# Patient Record
Sex: Female | Born: 1995
Health system: Southern US, Community
[De-identification: ages and names within clinical notes are randomized; demographics above are authoritative.]

## PROBLEM LIST (undated history)

## (undated) DIAGNOSIS — G479 Sleep disorder, unspecified: Secondary | ICD-10-CM

## (undated) DIAGNOSIS — IMO0001 Reserved for inherently not codable concepts without codable children: Secondary | ICD-10-CM

## (undated) DIAGNOSIS — F32A Depression, unspecified: Secondary | ICD-10-CM

## (undated) DIAGNOSIS — F329 Major depressive disorder, single episode, unspecified: Secondary | ICD-10-CM

## (undated) DIAGNOSIS — F419 Anxiety disorder, unspecified: Secondary | ICD-10-CM

## (undated) HISTORY — PX: TONSILLECTOMY: SUR1361

## (undated) HISTORY — DX: Sleep disorder, unspecified: G47.9

## (undated) HISTORY — DX: Reserved for inherently not codable concepts without codable children: IMO0001

## (undated) HISTORY — PX: WISDOM TOOTH EXTRACTION: SHX21

---
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
## 2014-01-04 ENCOUNTER — Ambulatory Visit (INDEPENDENT_AMBULATORY_CARE_PROVIDER_SITE_OTHER): Payer: BC Managed Care – PPO | Admitting: Internal Medicine

## 2014-01-04 ENCOUNTER — Encounter: Payer: Self-pay | Admitting: Internal Medicine

## 2014-01-04 VITALS — BP 116/58 | HR 82 | Temp 99.0°F | Wt 131.5 lb

## 2014-01-04 DIAGNOSIS — N912 Amenorrhea, unspecified: Secondary | ICD-10-CM

## 2014-01-04 DIAGNOSIS — Z3201 Encounter for pregnancy test, result positive: Secondary | ICD-10-CM

## 2014-01-04 LAB — POCT URINE PREGNANCY: PREG TEST UR: POSITIVE

## 2014-01-04 NOTE — Progress Notes (Signed)
Pre visit review using our clinic review tool, if applicable. No additional management support is needed unless otherwise documented below in the visit note. 

## 2014-01-04 NOTE — Patient Instructions (Signed)
Pregnancy - First Trimester  During sexual intercourse, millions of sperm go into the vagina. Only 1 sperm will penetrate and fertilize the female egg while it is in the Fallopian tube. One week later, the fertilized egg implants into the wall of the uterus. An embryo begins to develop into a baby. At 6 to 8 weeks, the eyes and face are formed and the heartbeat can be seen on ultrasound. At the end of 12 weeks (first trimester), all the baby's organs are formed. Now that you are pregnant, you will want to do everything you can to have a healthy baby. Two of the most important things are to get good prenatal care and follow your caregiver's instructions. Prenatal care is all the medical care you receive before the baby's birth. It is given to prevent, find, and treat problems during the pregnancy and childbirth.  PRENATAL EXAMS  · During prenatal visits, your weight, blood pressure, and urine are checked. This is done to make sure you are healthy and progressing normally during the pregnancy.  · A pregnant woman should gain 25 to 35 pounds during the pregnancy. However, if you are overweight or underweight, your caregiver will advise you regarding your weight.  · Your caregiver will ask and answer questions for you.  · Blood work, cervical cultures, other necessary tests, and a Pap test are done during your prenatal exams. These tests are done to check on your health and the probable health of your baby. Tests are strongly recommended and done for HIV with your permission. This is the virus that causes AIDS. These tests are done because medicines can be given to help prevent your baby from being born with this infection should you have been infected without knowing it. Blood work is also used to find out your blood type, previous infections, and follow your blood levels (hemoglobin).  · Low hemoglobin (anemia) is common during pregnancy. Iron and vitamins are given to help prevent this. Later in the pregnancy, blood  tests for diabetes will be done along with any other tests if any problems develop.  · You may need other tests to make sure you and the baby are doing well.  CHANGES DURING THE FIRST TRIMESTER   Your body goes through many changes during pregnancy. They vary from person to person. Talk to your caregiver about changes you notice and are concerned about. Changes can include:  · Your menstrual period stops.  · The egg and sperm carry the genes that determine what you look like. Genes from you and your partner are forming a baby. The female genes determine whether the baby is a boy or a girl.  · Your body increases in girth and you may feel bloated.  · Feeling sick to your stomach (nauseous) and throwing up (vomiting). If the vomiting is uncontrollable, call your caregiver.  · Your breasts will begin to enlarge and become tender.  · Your nipples may stick out more and become darker.  · The need to urinate more. Painful urination may mean you have a bladder infection.  · Tiring easily.  · Loss of appetite.  · Cravings for certain kinds of food.  · At first, you may gain or lose a couple of pounds.  · You may have changes in your emotions from day to day (excited to be pregnant or concerned something may go wrong with the pregnancy and baby).  · You may have more vivid and strange dreams.  HOME CARE INSTRUCTIONS   ·   It is very important to avoid all smoking, alcohol and non-prescribed drugs during your pregnancy. These affect the formation and growth of the baby. Avoid chemicals while pregnant to ensure the delivery of a healthy infant.  · Start your prenatal visits by the 12th week of pregnancy. They are usually scheduled monthly at first, then more often in the last 2 months before delivery. Keep your caregiver's appointments. Follow your caregiver's instructions regarding medicine use, blood and lab tests, exercise, and diet.  · During pregnancy, you are providing food for you and your baby. Eat regular, well-balanced  meals. Choose foods such as meat, fish, milk and other low fat dairy products, vegetables, fruits, and whole-grain breads and cereals. Your caregiver will tell you of the ideal weight gain.  · You can help morning sickness by keeping soda crackers at the bedside. Eat a couple before arising in the morning. You may want to use the crackers without salt on them.  · Eating 4 to 5 small meals rather than 3 large meals a day also may help the nausea and vomiting.  · Drinking liquids between meals instead of during meals also seems to help nausea and vomiting.  · A physical sexual relationship may be continued throughout pregnancy if there are no other problems. Problems may be early (premature) leaking of amniotic fluid from the membranes, vaginal bleeding, or belly (abdominal) pain.  · Exercise regularly if there are no restrictions. Check with your caregiver or physical therapist if you are unsure of the safety of some of your exercises. Greater weight gain will occur in the last 2 trimesters of pregnancy. Exercising will help:  · Control your weight.  · Keep you in shape.  · Prepare you for labor and delivery.  · Help you lose your pregnancy weight after you deliver your baby.  · Wear a good support or jogging bra for breast tenderness during pregnancy. This may help if worn during sleep too.  · Ask when prenatal classes are available. Begin classes when they are offered.  · Do not use hot tubs, steam rooms, or saunas.  · Wear your seat belt when driving. This protects you and your baby if you are in an accident.  · Avoid raw meat, uncooked cheese, cat litter boxes, and soil used by cats throughout the pregnancy. These carry germs that can cause birth defects in the baby.  · The first trimester is a good time to visit your dentist for your dental health. Getting your teeth cleaned is okay. Use a softer toothbrush and brush gently during pregnancy.  · Ask for help if you have financial, counseling, or nutritional needs  during pregnancy. Your caregiver will be able to offer counseling for these needs as well as refer you for other special needs.  · Do not take any medicines or herbs unless told by your caregiver.  · Inform your caregiver if there is any mental or physical domestic violence.  · Make a list of emergency phone numbers of family, friends, hospital, and police and fire departments.  · Write down your questions. Take them to your prenatal visit.  · Do not douche.  · Do not cross your legs.  · If you have to stand for long periods of time, rotate you feet or take small steps in a circle.  · You may have more vaginal secretions that may require a sanitary pad. Do not use tampons or scented sanitary pads.  MEDICINES AND DRUG USE IN PREGNANCY  ·   Take prenatal vitamins as directed. The vitamin should contain 1 milligram of folic acid. Keep all vitamins out of reach of children. Only a couple vitamins or tablets containing iron may be fatal to a baby or young child when ingested.  · Avoid use of all medicines, including herbs, over-the-counter medicines, not prescribed or suggested by your caregiver. Only take over-the-counter or prescription medicines for pain, discomfort, or fever as directed by your caregiver. Do not use aspirin, ibuprofen, or naproxen unless directed by your caregiver.  · Let your caregiver also know about herbs you may be using.  · Alcohol is related to a number of birth defects. This includes fetal alcohol syndrome. All alcohol, in any form, should be avoided completely. Smoking will cause low birth rate and premature babies.  · Street or illegal drugs are very harmful to the baby. They are absolutely forbidden. A baby born to an addicted mother will be addicted at birth. The baby will go through the same withdrawal an adult does.  · Let your caregiver know about any medicines that you have to take and for what reason you take them.  SEEK MEDICAL CARE IF:   You have any concerns or worries during your  pregnancy. It is better to call with your questions if you feel they cannot wait, rather than worry about them.  SEEK IMMEDIATE MEDICAL CARE IF:   · An unexplained oral temperature above 102° F (38.9° C) develops, or as your caregiver suggests.  · You have leaking of fluid from the vagina (birth canal). If leaking membranes are suspected, take your temperature and inform your caregiver of this when you call.  · There is vaginal spotting or bleeding. Notify your caregiver of the amount and how many pads are used.  · You develop a bad smelling vaginal discharge with a change in the color.  · You continue to feel sick to your stomach (nauseated) and have no relief from remedies suggested. You vomit blood or coffee ground-like materials.  · You lose more than 2 pounds of weight in 1 week.  · You gain more than 2 pounds of weight in 1 week and you notice swelling of your face, hands, feet, or legs.  · You gain 5 pounds or more in 1 week (even if you do not have swelling of your hands, face, legs, or feet).  · You get exposed to German measles and have never had them.  · You are exposed to fifth disease or chickenpox.  · You develop belly (abdominal) pain. Round ligament discomfort is a common non-cancerous (benign) cause of abdominal pain in pregnancy. Your caregiver still must evaluate this.  · You develop headache, fever, diarrhea, pain with urination, or shortness of breath.  · You fall or are in a car accident or have any kind of trauma.  · There is mental or physical violence in your home.  Document Released: 08/28/2001 Document Revised: 05/28/2012 Document Reviewed: 03/01/2009  [employer]® Patient Information ©2014 [employer].

## 2014-01-04 NOTE — Progress Notes (Signed)
Subjective:    Patient ID: Caroline Gonzalez, female    DOB: 1996/09/17, 18 y.o.   MRN: [id number]  HPI  Pt presents to the clinic today with c/o possible pregnancy. She reports she took a home pregnancy test and it was positive. This was not planned but she is excited. Her LMP was 11/28/13. She denies any symptoms that she is aware of.  Review of Systems      Past Medical History  Diagnosis Date  . Depression   . Anxiety     Current Outpatient Prescriptions  Medication Sig Dispense Refill  . FLUoxetine (PROZAC) 40 MG capsule Take 40 mg by mouth daily.      . hydrOXYzine (ATARAX/VISTARIL) 25 MG tablet Take 25 mg by mouth 2 (two) times daily.      Marland Kitchen. lamoTRIgine (LAMICTAL) 100 MG tablet Take 100 mg by mouth daily. Take 1 tablet in the morning and 1/2 tablet at bedtime      . QUEtiapine (SEROQUEL) 50 MG tablet Take 50 mg by mouth at bedtime.       No current facility-administered medications for this visit.    Allergies  Allergen Reactions  . Codeine     REACTION: hallucinations  . Morphine     REACTION: hallucinations  . Sulfa Antibiotics     No family history on file.  History   Social History  . Marital Status: Married    Spouse Name: N/A    Number of Children: N/A  . Years of Education: N/A   Occupational History  . Not on file.   Social History Main Topics  . Smoking status: Former Smoker -- 0.25 packs/day    Types: Cigarettes  . Smokeless tobacco: Never Used  . Alcohol Use: No  . Drug Use: No     Comment: THC--recently quit  . Sexual Activity: No   Other Topics Concern  . Not on file   Social History Narrative  . No narrative on file     Constitutional: Denies fever, malaise, fatigue, headache or abrupt weight changes.  Gastrointestinal: Denies abdominal pain, bloating, constipation, diarrhea or blood in the stool.  GU: Pt reports missed menses. Denies urgency, frequency, pain with urination, burning sensation, blood in urine, odor or  discharge.   No other specific complaints in a complete review of systems (except as listed in HPI above).  Objective:   Physical Exam   BP 116/58  Pulse 82  Temp(Src) 99 F (37.2 C) (Oral)  Wt 131 lb 8 oz (59.648 kg)  SpO2 99%  LMP 12/01/2013 Wt Readings from Last 3 Encounters:  01/04/14 131 lb 8 oz (59.648 kg) (62%*, Z = 0.30)  10/28/13 131 lb 8 oz (59.648 kg) (63%*, Z = 0.33)  09/02/13 132 lb 12 oz (60.215 kg) (65%*, Z = 0.39)   * Growth percentiles are based on CDC 2-20 Years data.    General: Appears her stated age, well developed, well nourished in NAD. Cardiovascular: Normal rate and rhythm. S1,S2 noted.  No murmur, rubs or gallops noted. No JVD or BLE edema. No carotid bruits noted. Pulmonary/Chest: Normal effort and positive vesicular breath sounds. No respiratory distress. No wheezes, rales or ronchi noted.  Abdomen: Soft and nontender. Normal bowel sounds, no bruits noted. No distention or masses noted. Liver, spleen and kidneys non palpable.        Assessment & Plan:  Positive pregnancy test:  Discussed things not to do in the first trimester She has decided to quit smoking Urine  HCg positive Encouraged her to call her psychiatrist to discuss her psych meds during pregnancy [facility]  Followup as needed

## 2014-01-05 ENCOUNTER — Telehealth: Payer: Self-pay

## 2014-01-05 NOTE — Telephone Encounter (Signed)
Mother was in there with her. Ok to give copy of results

## 2014-01-05 NOTE — Telephone Encounter (Signed)
Jasmine DecemberSharon, pts mother request copy of pregnancy test results to take to OB office;pt has 1st OB appt 01/06/14. do not see DPR signed and unable to reach pt by phone. Please advise.

## 2014-01-06 NOTE — Telephone Encounter (Signed)
Spoke to pt's mother and she is aware that a copy of results is ready

## 2014-05-06 ENCOUNTER — Telehealth: Payer: Self-pay

## 2014-05-06 NOTE — Telephone Encounter (Signed)
Pts mother request copy of immunizations for college; notified copy at front desk for pick up.

## 2014-05-17 ENCOUNTER — Encounter: Payer: Self-pay | Admitting: Maternal & Fetal Medicine

## 2014-05-18 ENCOUNTER — Encounter: Payer: Self-pay | Admitting: Maternal & Fetal Medicine

## 2014-06-01 ENCOUNTER — Observation Stay: Payer: Self-pay | Admitting: Obstetrics and Gynecology

## 2014-07-22 IMAGING — CT CT ABD-PELV W/ CM
2 of 4 series · 17 of 46 positions shown, 19 images · IV contrast (30CC OMNI 300 ?  & [ID] OMNI 300)
Comparison: None.

CLINICAL DATA: Question appendicitis, renal stones.  Leukocytosis
with mid abdominal pain.

CT ABDOMEN AND PELVIS WITH CONTRAST
TECHNIQUE: Multidetector CT imaging of the abdomen and pelvis was
performed following the standard protocol during bolus
administration of intravenous contrast.
Contrast: 100mL OMNIPAQUE IOHEXOL 300 MG/ML  SOLN

[Series 2: abd/pelvis with · axial · 0.70mm/px · z∈[-483,-43]mm · 14 of 96 slices shown, 16 images]
[im 4/96  soft-tissue]
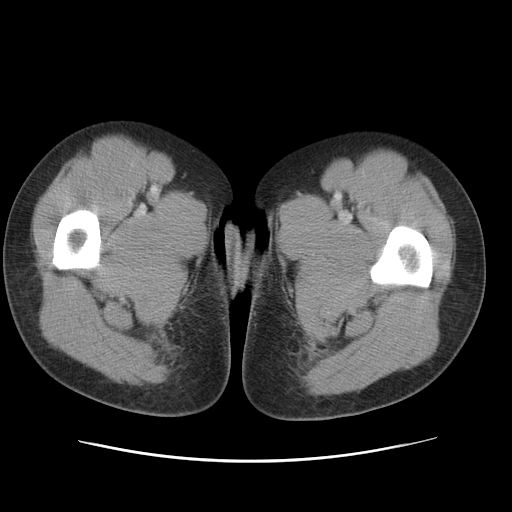
[im 4/96  bone]
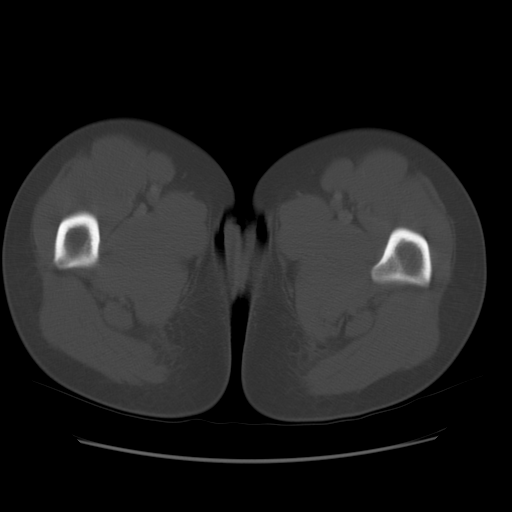
[im 11/96  soft-tissue]
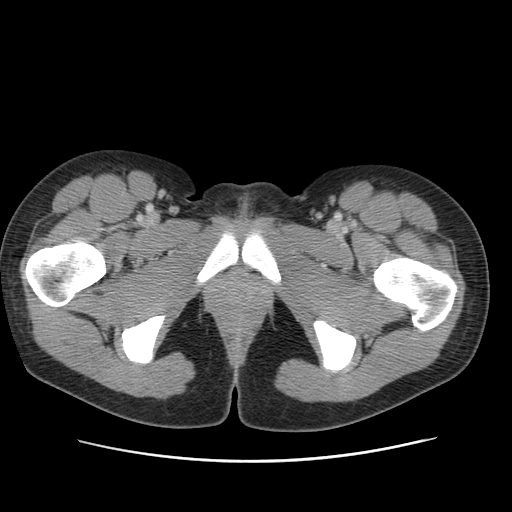
[im 18/96  soft-tissue]
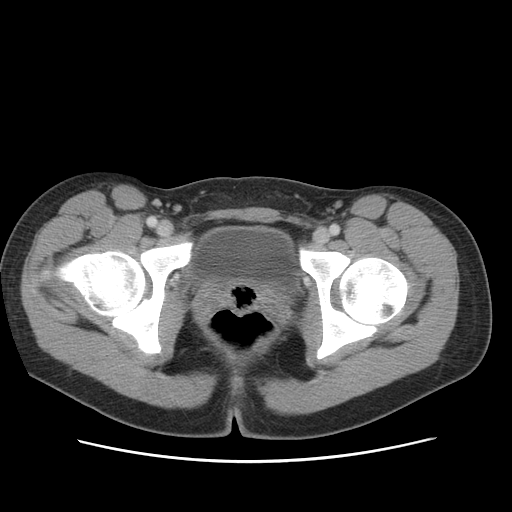
[im 25/96  soft-tissue]
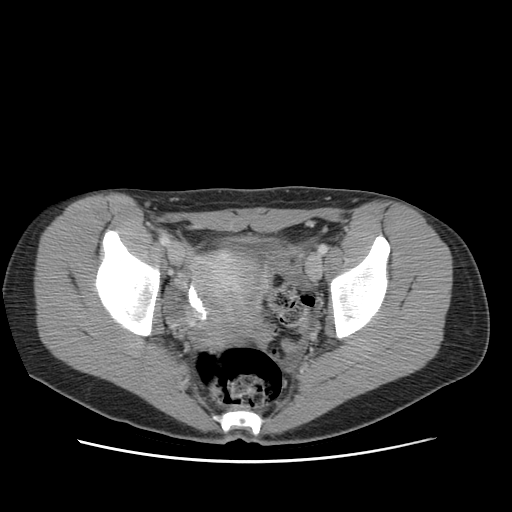
[im 32/96  soft-tissue]
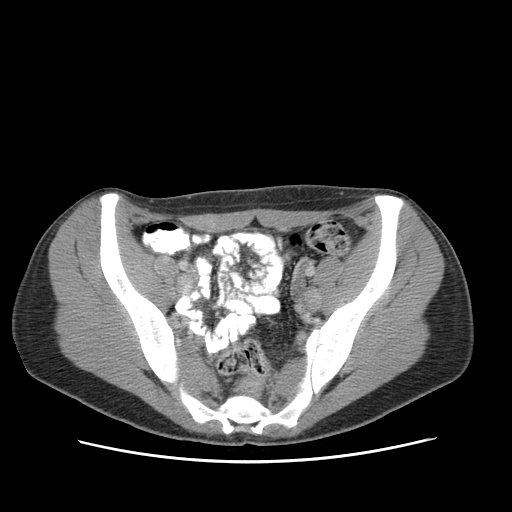
[im 39/96  soft-tissue]
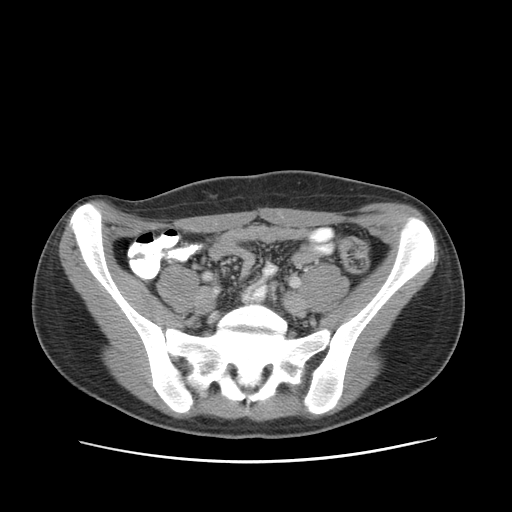
[im 46/96  soft-tissue]
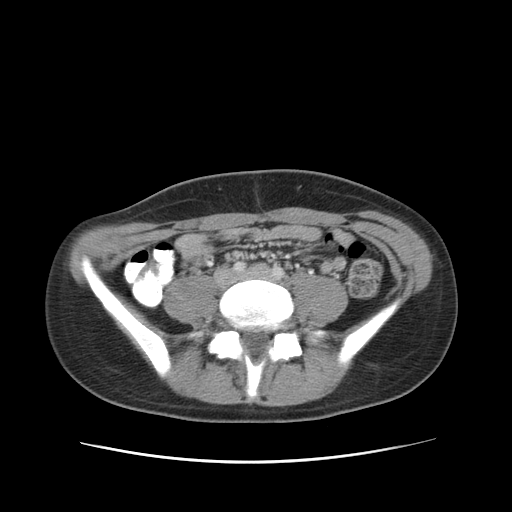
[im 50/96  soft-tissue]
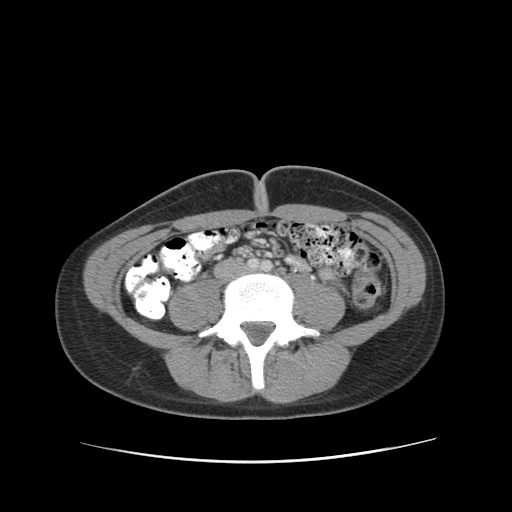
[im 57/96  soft-tissue]
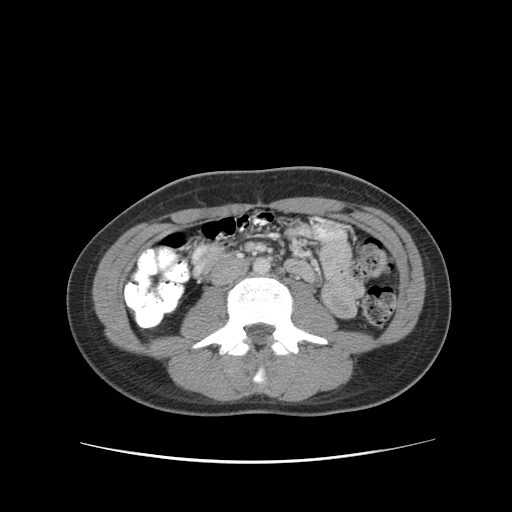
[im 57/96  bone]
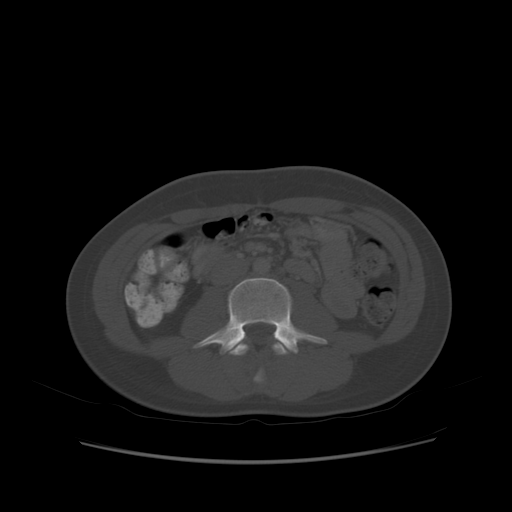
[im 64/96  soft-tissue]
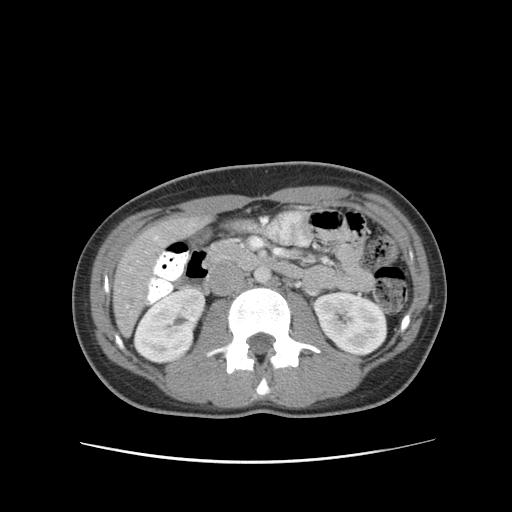
[im 71/96  soft-tissue]
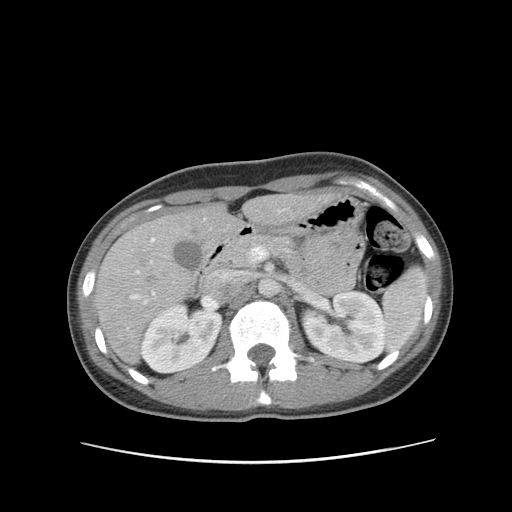
[im 78/96  soft-tissue]
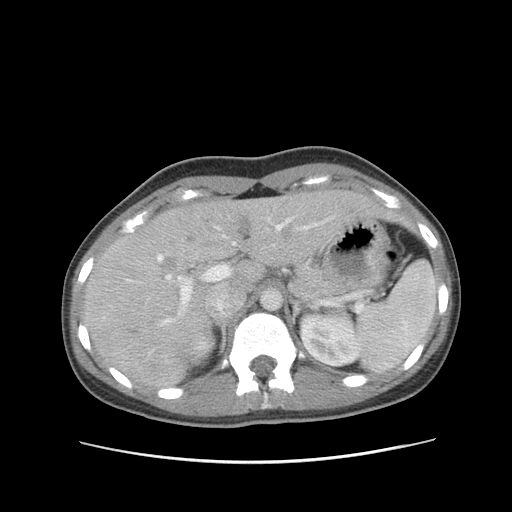
[im 85/96  soft-tissue]
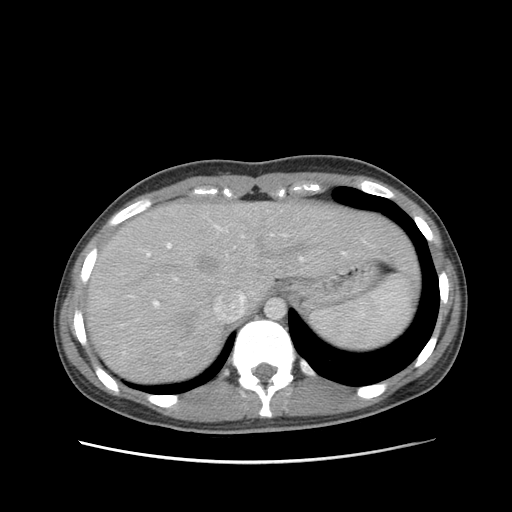
[im 92/96  soft-tissue]
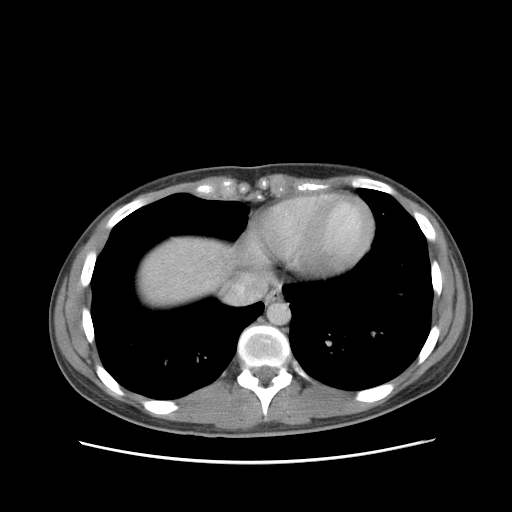

[Series 400: cor · coronal · 0.95mm/px · 3 of 110 slices shown]
[im 37/110  soft-tissue]
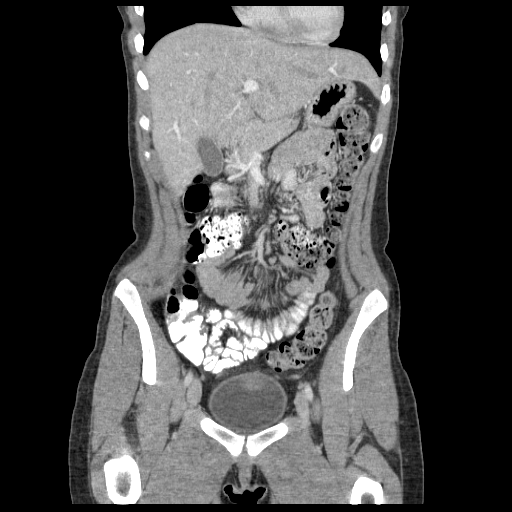
[im 49/110  soft-tissue]
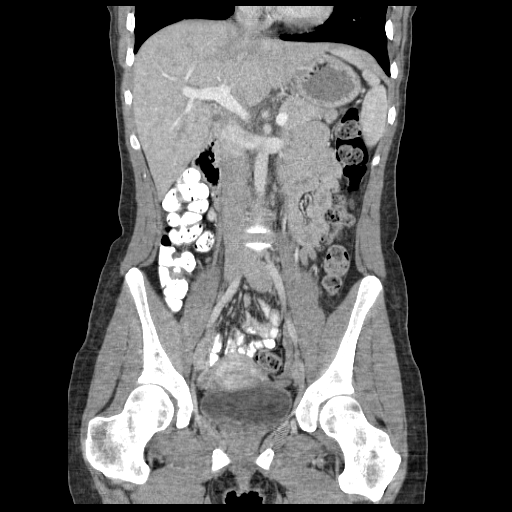
[im 61/110  soft-tissue]
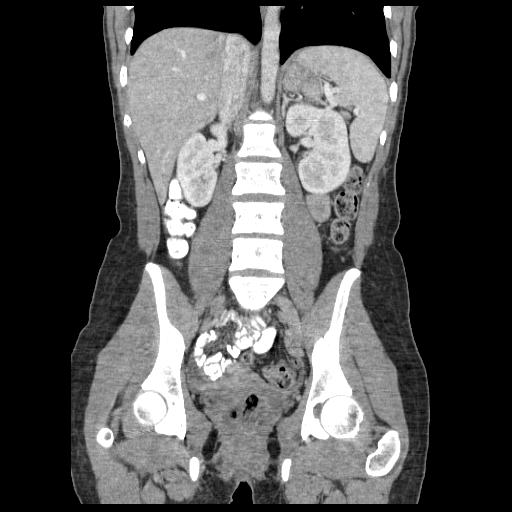

[17 of 46 positions shown; findings below may reference images not displayed]

FINDINGS: Lung bases show no acute findings.  Heart size normal.
No pericardial or pleural effusion.

Liver, gallbladder, adrenal glands, kidneys, spleen, pancreas,
stomach, small bowel and appendix unremarkable.  A fair amount
stool is seen in the colon which is otherwise unremarkable.

Trace pelvic free fluid.  Uterus and ovaries are visualized.
Scattered unenlarged lymph nodes.  No worrisome lytic or sclerotic
lesions.
IMPRESSION: 1.  No evidence of urinary obstruction or appendicitis.
2.  Suspect mild constipation.
3.  Trace pelvic free fluid is likely physiologic.

## 2014-09-01 ENCOUNTER — Observation Stay: Payer: Self-pay | Admitting: Obstetrics and Gynecology

## 2014-09-06 ENCOUNTER — Inpatient Hospital Stay: Payer: Self-pay

## 2014-09-06 LAB — CBC WITH DIFFERENTIAL/PLATELET
BASOS ABS: 0.1 10*3/uL (ref 0.0–0.1)
Basophil %: 0.6 %
Eosinophil #: 0.1 10*3/uL (ref 0.0–0.7)
Eosinophil %: 0.9 %
HCT: 42.3 % (ref 35.0–47.0)
HGB: 13.8 g/dL (ref 12.0–16.0)
Lymphocyte #: 2.3 10*3/uL (ref 1.0–3.6)
Lymphocyte %: 16.9 %
MCH: 28.8 pg (ref 26.0–34.0)
MCHC: 32.6 g/dL (ref 32.0–36.0)
MCV: 88 fL (ref 80–100)
Monocyte #: 1.5 x10 3/mm — ABNORMAL HIGH (ref 0.2–0.9)
Monocyte %: 10.6 %
NEUTROS PCT: 71 %
Neutrophil #: 9.9 10*3/uL — ABNORMAL HIGH (ref 1.4–6.5)
Platelet: 261 10*3/uL (ref 150–440)
RBC: 4.79 10*6/uL (ref 3.80–5.20)
RDW: 13.1 % (ref 11.5–14.5)
WBC: 13.9 10*3/uL — ABNORMAL HIGH (ref 3.6–11.0)

## 2014-09-07 LAB — HEMATOCRIT: HCT: 38.4 % (ref 35.0–47.0)

## 2014-09-07 LAB — GC/CHLAMYDIA PROBE AMP

## 2015-01-25 NOTE — H&P (Signed)
L&D Evaluation:  History:  HPI 19yo SWF presents for IOL for postterm pregnancy, 40w 2d G1P0000; PNC complicated by shortened cervix and anxiety.   Patient's Medical History anxiety   Patient's Surgical History T&A   Medications Pre Natal Vitamins  Tylenol (Acetaminophen)  Diclegis   Allergies Sulfa, Codeine, TamiFlu   Social History none   Family History Non-Contributory   ROS:  ROS All systems were reviewed.  HEENT, CNS, GI, GU, Respiratory, CV, Renal and Musculoskeletal systems were found to be normal.   Exam:  Vital Signs stable   General no apparent distress   Mental Status clear   Chest clear   Heart normal sinus rhythm   Abdomen gravid, non-tender   Estimated Fetal Weight Average for gestational age   Fetal Position vtx   Edema no edema   Pelvic no external lesions, 4/75/-2   Mebranes Ruptured, AROM   Description clear   FHT normal rate with no decels   Fetal Heart Rate 119   Ucx irregular   Ucx Frequency 6 min   Length of each Contraction 40 seconds   Ucx Pain Scale 2   Skin dry   Impression:  Impression reactive NST, Postterm IOL   Plan:  Plan monitor contractions and for cervical change, antibiotics for GBBS prophylaxis, pitocin IOL   Electronic Signatures: Yolanda BonineBurr, Melody N (CNM)  (Signed 21-Dec-15 09:36)  Authored: L&D Evaluation   Last Updated: 21-Dec-15 09:36 by Ulyses AmorBurr, Melody N (CNM)

## 2015-02-17 ENCOUNTER — Encounter: Payer: Self-pay | Admitting: Obstetrics and Gynecology

## 2015-02-17 ENCOUNTER — Ambulatory Visit (INDEPENDENT_AMBULATORY_CARE_PROVIDER_SITE_OTHER): Payer: BLUE CROSS/BLUE SHIELD | Admitting: Obstetrics and Gynecology

## 2015-02-17 ENCOUNTER — Encounter: Payer: Self-pay | Admitting: *Deleted

## 2015-02-17 VITALS — BP 128/63 | HR 85 | Ht 67.0 in | Wt 152.9 lb

## 2015-02-17 DIAGNOSIS — Z0000 Encounter for general adult medical examination without abnormal findings: Secondary | ICD-10-CM | POA: Diagnosis not present

## 2015-02-17 MED ORDER — FLUOXETINE HCL 40 MG PO CAPS: 40.0000 mg | ORAL_CAPSULE | Freq: Every day | ORAL | Status: DC

## 2015-02-17 NOTE — Progress Notes (Signed)
SUBJECTIVE:  19 y.o. female for annual routine Pap and checkup. Current Outpatient Prescriptions  Medication Sig Dispense Refill  . etonogestrel (NEXPLANON) 68 MG IMPL implant 1 each by Subdermal route once.    Marland Kitchen. FLUoxetine (PROZAC) 40 MG capsule Take 40 mg by mouth daily.     No current facility-administered medications for this visit.   Allergies: Codeine; Morphine; Sulfa antibiotics; and Tamiflu  No LMP recorded.  ROS:  Feeling well. No dyspnea or chest pain on exertion.  No abdominal pain, change in bowel habits, black or bloody stools.  No urinary tract symptoms. GYN ROS: no breast pain or new or enlarging lumps on self exam, no vaginal bleeding. No neurological complaints.  OBJECTIVE:  The patient appears well, alert, oriented x 3, in no distress. BP 128/63 mmHg  Pulse 85  Ht 5\' 7"  (1.702 m)  Wt 152 lb 14.4 oz (69.355 kg)  BMI 23.94 kg/m2  Breastfeeding? Yes ENT normal.  Neck supple. No adenopathy or thyromegaly. PERLA. Lungs are clear, good air entry, no wheezes, rhonchi or rales. S1 and S2 normal, no murmurs, regular rate and rhythm. Abdomen soft without tenderness, guarding, mass or organomegaly. Extremities show no edema, normal peripheral pulses. Neurological is normal, no focal findings.  BREAST EXAM: patient declines to have breast exam  PELVIC EXAM: normal external genitalia, vulva, vagina, cervix, uterus and adnexa  ASSESSMENT:  well woman Amenorrhea secondary to breastfeeding Anxiety under good control with SSRI  PLAN:  Refill prozac x 1 year  Melody N Burr,CNM return annually or prn

## 2015-08-09 ENCOUNTER — Ambulatory Visit (INDEPENDENT_AMBULATORY_CARE_PROVIDER_SITE_OTHER): Payer: BLUE CROSS/BLUE SHIELD

## 2015-08-09 DIAGNOSIS — Z23 Encounter for immunization: Secondary | ICD-10-CM | POA: Diagnosis not present

## 2015-12-23 ENCOUNTER — Telehealth: Payer: Self-pay | Admitting: Obstetrics and Gynecology

## 2015-12-23 NOTE — Telephone Encounter (Signed)
If patient is doing well on prozac, i don't want to change it. Have her to ask pharmacy to send refill request.

## 2015-12-23 NOTE — Telephone Encounter (Signed)
CALLED MOTHER BACK AND TOLD HER THAT AS LONG AS SHE IS DOING GOOD ON PROZAC SHE NEEDS TO STAY ON IT, MOTHER STATED THAT SHE IS DOING OK ON IT THAT SHE STOPPED BREASTFEEDING AND WAS HAVING A HARD TIME WITH THAT.. SO MOTHER IS GOING TO CALL IN REQ FROM PHARMACY TO SEND REFILL ON PROZAC AND IF OVER THE NEXT MONTH IT DOESN'T HELP SHE WILL CALL US FOR PT TO BE SEEN TO CHANGE IF NECESSARY.

## 2015-12-23 NOTE — Telephone Encounter (Signed)
PTS MOTHER CALLED AND SHE DOES NEED A REFILL ON THE PROZAC, BUT MOTHER WAS WANTING TO KNOW IF IT COULD BE CHANGED CELEXA. IF NOT THEN SHE SAID WE CAN CALL HER AND GET HER TO REQ. A REFILL FROM HER PHARMACY FOR THE PROZAC, SHE HAS 2 PILLS LEFT OF THE PROZAC.

## 2016-02-14 ENCOUNTER — Telehealth: Payer: Self-pay | Admitting: Obstetrics and Gynecology

## 2016-02-14 ENCOUNTER — Other Ambulatory Visit: Payer: Self-pay | Admitting: Obstetrics and Gynecology

## 2016-02-14 MED ORDER — NORGESTIMATE-ETH ESTRADIOL 0.25-35 MG-MCG PO TABS: 1.0000 | ORAL_TABLET | Freq: Every day | ORAL | Status: DC

## 2016-02-14 NOTE — Telephone Encounter (Signed)
Please let her know I sent in a prescription for pills- to take one daily until bleeding stops, and if it doesn't work, we may need to switch BC method.

## 2016-02-14 NOTE — Telephone Encounter (Signed)
pls advise

## 2016-02-14 NOTE — Telephone Encounter (Signed)
She has implant in arm ... She is having heavy bleeding continuously

## 2016-02-14 NOTE — Telephone Encounter (Signed)
Left message for pt

## 2016-02-21 ENCOUNTER — Telehealth: Payer: Self-pay | Admitting: *Deleted

## 2016-02-21 NOTE — Telephone Encounter (Signed)
Tell her she may need to consider removing the implant since bleeding continues without taking the pills. Will need to consider another form of birth control. She is fine to take the pills to stop bleeding until her appointment as it is not harmful.  Tyrese Capriotti

## 2016-02-21 NOTE — Telephone Encounter (Signed)
Patient mother called and stated the patient is on birth control and is having some irregular bleeding and cramping. She has the implant and was also taking birth control pills to stop the bleeding while she had the implant in her arm. She stop taking the pills after 3 days. Patient is complaining of irregular bleeding and cramping. Patient has an appt with Melody on 03/21/16. Patient is requesting a call back [phone].

## 2016-02-23 ENCOUNTER — Telehealth: Payer: Self-pay | Admitting: Obstetrics and Gynecology

## 2016-02-23 NOTE — Telephone Encounter (Signed)
Pt mother aware and will have her resume pills. Pt had stopped because she stopped bleeding. Now she is bleeding, cramping and bloating. To continue until her appt. Will take IBP for cramping/pain as needed.

## 2016-03-06 ENCOUNTER — Telehealth: Payer: Self-pay | Admitting: Obstetrics and Gynecology

## 2016-03-06 NOTE — Telephone Encounter (Signed)
Patients Mother Jasmine DecemberSharon called stating the prozac Caroline Gonzalez is on is not working anymore. She stated that Melody told her to call if the meds stopped working. The mother can be reached at [phone]. Thanks

## 2016-03-07 NOTE — Telephone Encounter (Signed)
Please let her know to stay on it until appt on 03/21/16, and that she can increase the dose to 2 pills daily till then to see if that helps.

## 2016-03-07 NOTE — Telephone Encounter (Signed)
Pt notified to increase prozac to 2 pills daily until visit on 03/21/2016 per MNS.

## 2016-03-08 NOTE — Telephone Encounter (Signed)
MOTHER CALLED BACK AND SAID SHE LEFT MSG 2 DAYS AGO AND NOONE CALLED HER BACK.

## 2016-03-08 NOTE — Telephone Encounter (Signed)
Caroline Gonzalez did you call her yet- I sent the message to you

## 2016-03-08 NOTE — Telephone Encounter (Signed)
I called Caroline Gonzalez herself and told her that her mother had called and to let her know that we had spoke.

## 2016-03-16 ENCOUNTER — Telehealth: Payer: Self-pay | Admitting: *Deleted

## 2016-03-16 NOTE — Telephone Encounter (Signed)
Patient mother called and stated that the patient will need a refill of her birth control pill. She does have the Nexplanon implant as well.  Patient will be out on Monday and she sees Melody on 03/21/16. Her pharmacy is [facility] on [address]. Thanks

## 2016-03-16 NOTE — Telephone Encounter (Signed)
Ok to refill 

## 2016-03-19 ENCOUNTER — Other Ambulatory Visit: Payer: Self-pay | Admitting: *Deleted

## 2016-03-19 MED ORDER — NORGESTIMATE-ETH ESTRADIOL 0.25-35 MG-MCG PO TABS: 1.0000 | ORAL_TABLET | Freq: Every day | ORAL | Status: DC

## 2016-03-19 NOTE — Telephone Encounter (Signed)
Done-ac 

## 2016-03-19 NOTE — Telephone Encounter (Signed)
Yes to refill     

## 2016-03-21 ENCOUNTER — Ambulatory Visit (INDEPENDENT_AMBULATORY_CARE_PROVIDER_SITE_OTHER): Payer: BLUE CROSS/BLUE SHIELD | Admitting: Obstetrics and Gynecology

## 2016-03-21 ENCOUNTER — Encounter: Payer: Self-pay | Admitting: Obstetrics and Gynecology

## 2016-03-21 VITALS — BP 124/80 | HR 88 | Ht 67.0 in | Wt 152.5 lb

## 2016-03-21 DIAGNOSIS — Z3046 Encounter for surveillance of implantable subdermal contraceptive: Secondary | ICD-10-CM | POA: Diagnosis not present

## 2016-03-21 DIAGNOSIS — Z30430 Encounter for insertion of intrauterine contraceptive device: Secondary | ICD-10-CM | POA: Diagnosis not present

## 2016-03-21 DIAGNOSIS — Z975 Presence of (intrauterine) contraceptive device: Secondary | ICD-10-CM

## 2016-03-21 DIAGNOSIS — N921 Excessive and frequent menstruation with irregular cycle: Secondary | ICD-10-CM | POA: Diagnosis not present

## 2016-03-21 MED ORDER — LEVONORGESTREL 20 MCG/24HR IU IUD: 1.0000 | INTRAUTERINE_SYSTEM | Freq: Once | INTRAUTERINE | Status: DC

## 2016-03-21 NOTE — Progress Notes (Signed)
Caroline ComesBrandi Tyson Gonzalez is a 20 y.o. year old Caucasian female here for Implanon removal.  Patient given informed consent for removal of her Implanon due to prolonged menses. Desires switching to Mirena IUD.  BP 124/80 mmHg  Pulse 88  Ht 5\' 7"  (1.702 m)  Wt 152 lb 8 oz (69.174 kg)  BMI 23.88 kg/m2  LMP 03/14/2016  Breastfeeding? No  Appropriate time out taken. Implanon site identified.  Area prepped in usual sterile fashon. One cc of 2% lidocaine was used to anesthetize the area at the distal end of the implant. A small stab incision was made right beside the implant on the distal portion.  The Implanon rod was grasped using hemostats and removed without difficulty.  There was less than 3 cc blood loss. There were no complications.  Steri-strips were applied over the small incision and a pressure bandage was applied.  The patient tolerated the procedure well.  She was instructed to keep the area clean and dry, remove pressure bandage in 24 hours, and keep insertion site covered with the steri-strip for 3-5 days.    Follow-up PRN problems.         The risks and benefits of the method and placement have been thouroughly reviewed with the patient and all questions were answered.  Specifically the patient is aware of failure rate of 09/998, expulsion of the IUD and of possible perforation.  The patient is aware of irregular bleeding due to the method and understands the incidence of irregular bleeding diminishes with time.  Signed copy of informed consent in chart.   Time out was performed.  A Graves speculum was placed in the vagina.  The cervix was visualized, prepped using Betadine, and grasped with a single tooth tenaculum. The uterus was found to be neutral and it sounded to 7 cm.  Mirena IUD placed per manufacturer's recommendations.   The strings were trimmed to 3 cm.   The patient was given post procedure instructions, including signs and symptoms of infection and to check for the  strings after each menses or each month, and refraining from intercourse or anything in the vagina for 3 days.  She was given a Mirena care card with date Mirena placed, and date Mirena to be removed.    [name] [name] [name], CNM

## 2016-03-21 NOTE — Patient Instructions (Signed)

## 2016-04-26 ENCOUNTER — Telehealth: Payer: Self-pay | Admitting: Obstetrics and Gynecology

## 2016-04-26 NOTE — Telephone Encounter (Signed)
Patient called regarding issues with her birth control. You can reach her at [phone].thanks

## 2016-04-27 NOTE — Telephone Encounter (Signed)
Pt states she was doing well with IUD @ first, then about 2 weeks ago started bleeding, states she is bleeding heavily, is up moving a lot is changing pad q hour and 1/2

## 2016-04-27 NOTE — Telephone Encounter (Signed)
I would continue to watch, and we will check it at next weeks appt

## 2016-04-30 NOTE — Telephone Encounter (Signed)
Left detailed message for pt 

## 2016-05-03 ENCOUNTER — Encounter: Payer: BLUE CROSS/BLUE SHIELD | Admitting: Obstetrics and Gynecology

## 2016-05-11 ENCOUNTER — Other Ambulatory Visit: Payer: Self-pay | Admitting: Obstetrics and Gynecology

## 2016-05-17 ENCOUNTER — Encounter: Payer: Self-pay | Admitting: Obstetrics and Gynecology

## 2016-05-17 ENCOUNTER — Ambulatory Visit (INDEPENDENT_AMBULATORY_CARE_PROVIDER_SITE_OTHER): Payer: BLUE CROSS/BLUE SHIELD | Admitting: Obstetrics and Gynecology

## 2016-05-17 VITALS — BP 105/68 | HR 81 | Ht 67.0 in | Wt 153.7 lb

## 2016-05-17 DIAGNOSIS — F411 Generalized anxiety disorder: Secondary | ICD-10-CM | POA: Diagnosis not present

## 2016-05-17 DIAGNOSIS — Z975 Presence of (intrauterine) contraceptive device: Principal | ICD-10-CM

## 2016-05-17 DIAGNOSIS — N921 Excessive and frequent menstruation with irregular cycle: Secondary | ICD-10-CM | POA: Diagnosis not present

## 2016-05-17 MED ORDER — FLUOXETINE HCL 10 MG PO CAPS: 10.0000 mg | ORAL_CAPSULE | Freq: Every day | ORAL | 0 refills | Status: DC

## 2016-05-17 MED ORDER — ESCITALOPRAM OXALATE 10 MG PO TABS: 10.0000 mg | ORAL_TABLET | Freq: Every day | ORAL | 6 refills | Status: DC

## 2016-05-17 NOTE — Progress Notes (Signed)
Subjective:     Patient ID: Caroline Gonzalez, female   DOB: [birth year], 20 y.o.   MRN: [id number]  HPI Reports intermittent if not daily spotting and at times heavy menstrual bleeding since Mirena IUD placed on 04/05/16. States only a few days of no bleeding at all. Wants to make sure IUD is in correct place.  Also reports worsening anxiety, stating it exacerbated since delivery of son last year. Has daily crying spells, feels anxious and overwhelmed.  Depression screen PHQ 2/9 05/17/2016  Decreased Interest 1  Down, Depressed, Hopeless 3  PHQ - 2 Score 4  Altered sleeping 3  Tired, decreased energy 3  Change in appetite 1  Feeling bad or failure about yourself  3  Trouble concentrating 3  Moving slowly or fidgety/restless 1  Suicidal thoughts 0  PHQ-9 Score 18  has been on prozac for many years, and never felt like it really worked and isn't working now. Mom and boyfriend are supportive. Is able to function at work and school.   Review of Systems Negative ROS expect that stated in HPI above.    Objective:   Physical Exam A&Ox4 Well groomed female, anxious Blood pressure 105/68, pulse 81, height 5' 7" (1.702 m), weight 153 lb 11.2 oz (69.7 kg), last menstrual period 03/21/2016, not currently breastfeeding. Thyroid normal on exam HRR Abdomen soft and non-tender Pelvic exam: normal external genitalia, vulva, vagina, cervix, uterus and adnexa, IUD string noted with copious white discharge at cervix- no blood noted. Microscopic wet-mount exam shows negative for pathogens, normal epithelial cells.    Assessment:     BTB with IUD GAD- not under good control with current SSRI    Plan:     Reassured of normal findings with Mirena- desires to continue use at this time- will do menstrual charting and bring back at next visit in 6 weeks.  Discussed changing SSRI treatment, and will taper off prozac- starting at 22m daily now for 1 weeks,then 335mdaily daily for 1 week, then 2032mdaily for one week, then 10 mg x 1 week. Then stop prozac. I am adding lexapro [id number] to start now. RTC in 6 weeks or as needed.  Melody ShamPort VueM

## 2016-06-19 ENCOUNTER — Ambulatory Visit (INDEPENDENT_AMBULATORY_CARE_PROVIDER_SITE_OTHER): Payer: BLUE CROSS/BLUE SHIELD

## 2016-06-19 DIAGNOSIS — Z23 Encounter for immunization: Secondary | ICD-10-CM | POA: Diagnosis not present

## 2016-07-05 ENCOUNTER — Ambulatory Visit (INDEPENDENT_AMBULATORY_CARE_PROVIDER_SITE_OTHER): Payer: BLUE CROSS/BLUE SHIELD | Admitting: Obstetrics and Gynecology

## 2016-07-05 ENCOUNTER — Encounter: Payer: Self-pay | Admitting: Obstetrics and Gynecology

## 2016-07-05 VITALS — BP 118/78 | HR 98 | Ht 67.0 in | Wt 151.6 lb

## 2016-07-05 DIAGNOSIS — Z01419 Encounter for gynecological examination (general) (routine) without abnormal findings: Secondary | ICD-10-CM

## 2016-07-05 MED ORDER — ESCITALOPRAM OXALATE 10 MG PO TABS: 10.0000 mg | ORAL_TABLET | Freq: Every day | ORAL | 4 refills | Status: DC

## 2016-07-05 NOTE — Patient Instructions (Signed)
Preventive Care for Adults, Female A healthy lifestyle and preventive care can promote health and wellness. Preventive health guidelines for women include the following key practices.  A routine yearly physical is a good way to check with your health care provider about your health and preventive screening. It is a chance to share any concerns and updates on your health and to receive a thorough exam.  Visit your dentist for a routine exam and preventive care every 6 months. Brush your teeth twice a day and floss once a day. Good oral hygiene prevents tooth decay and gum disease.  The frequency of eye exams is based on your age, health, family medical history, use of contact lenses, and other factors. Follow your health care provider's recommendations for frequency of eye exams.  Eat a healthy diet. Foods like vegetables, fruits, whole grains, low-fat dairy products, and lean protein foods contain the nutrients you need without too many calories. Decrease your intake of foods high in solid fats, added sugars, and salt. Eat the right amount of calories for you.Get information about a proper diet from your health care provider, if necessary.  Regular physical exercise is one of the most important things you can do for your health. Most adults should get at least 150 minutes of moderate-intensity exercise (any activity that increases your heart rate and causes you to sweat) each week. In addition, most adults need muscle-strengthening exercises on 2 or more days a week.  Maintain a healthy weight. The body mass index (BMI) is a screening tool to identify possible weight problems. It provides an estimate of body fat based on height and weight. Your health care provider can find your BMI and can help you achieve or maintain a healthy weight.For adults 20 years and older:  A BMI below 18.5 is considered underweight.  A BMI of 18.5 to 24.9 is normal.  A BMI of 25 to 29.9 is considered  overweight.  A BMI of 30 and above is considered obese.  Maintain normal blood lipids and cholesterol levels by exercising and minimizing your intake of saturated fat. Eat a balanced diet with plenty of fruit and vegetables. Blood tests for lipids and cholesterol should begin at age 64 and be repeated every 5 years. If your lipid or cholesterol levels are high, you are over 50, or you are at high risk for heart disease, you may need your cholesterol levels checked more frequently.Ongoing high lipid and cholesterol levels should be treated with medicines if diet and exercise are not working.  If you smoke, find out from your health care provider how to quit. If you do not use tobacco, do not start.  Lung cancer screening is recommended for adults aged 52-80 years who are at high risk for developing lung cancer because of a history of smoking. A yearly low-dose CT scan of the lungs is recommended for people who have at least a 30-pack-year history of smoking and are a current smoker or have quit within the past 15 years. A pack year of smoking is smoking an average of 1 pack of cigarettes a day for 1 year (for example: 1 pack a day for 30 years or 2 packs a day for 15 years). Yearly screening should continue until the smoker has stopped smoking for at least 15 years. Yearly screening should be stopped for people who develop a health problem that would prevent them from having lung cancer treatment.  If you are pregnant, do not drink alcohol. If you are  breastfeeding, be very cautious about drinking alcohol. If you are not pregnant and choose to drink alcohol, do not have more than 1 drink per day. One drink is considered to be 12 ounces (355 mL) of beer, 5 ounces (148 mL) of wine, or 1.5 ounces (44 mL) of liquor.  Avoid use of street drugs. Do not share needles with anyone. Ask for help if you need support or instructions about stopping the use of drugs.  High blood pressure causes heart disease and  increases the risk of stroke. Your blood pressure should be checked at least every 1 to 2 years. Ongoing high blood pressure should be treated with medicines if weight loss and exercise do not work.  If you are 25-78 years old, ask your health care provider if you should take aspirin to prevent strokes.  Diabetes screening is done by taking a blood sample to check your blood glucose level after you have not eaten for a certain period of time (fasting). If you are not overweight and you do not have risk factors for diabetes, you should be screened once every 3 years starting at age 86. If you are overweight or obese and you are 3-87 years of age, you should be screened for diabetes every year as part of your cardiovascular risk assessment.  Breast cancer screening is essential preventive care for women. You should practice "breast self-awareness." This means understanding the normal appearance and feel of your breasts and may include breast self-examination. Any changes detected, no matter how small, should be reported to a health care provider. Women in their 66s and 30s should have a clinical breast exam (CBE) by a health care provider as part of a regular health exam every 1 to 3 years. After age 43, women should have a CBE every year. Starting at age 37, women should consider having a mammogram (breast X-ray test) every year. Women who have a family history of breast cancer should talk to their health care provider about genetic screening. Women at a high risk of breast cancer should talk to their health care providers about having an MRI and a mammogram every year.  Breast cancer gene (BRCA)-related cancer risk assessment is recommended for women who have family members with BRCA-related cancers. BRCA-related cancers include breast, ovarian, tubal, and peritoneal cancers. Having family members with these cancers may be associated with an increased risk for harmful changes (mutations) in the breast  cancer genes BRCA1 and BRCA2. Results of the assessment will determine the need for genetic counseling and BRCA1 and BRCA2 testing.  Your health care provider may recommend that you be screened regularly for cancer of the pelvic organs (ovaries, uterus, and vagina). This screening involves a pelvic examination, including checking for microscopic changes to the surface of your cervix (Pap test). You may be encouraged to have this screening done every 3 years, beginning at age 78.  For women ages 79-65, health care providers may recommend pelvic exams and Pap testing every 3 years, or they may recommend the Pap and pelvic exam, combined with testing for human papilloma virus (HPV), every 5 years. Some types of HPV increase your risk of cervical cancer. Testing for HPV may also be done on women of any age with unclear Pap test results.  Other health care providers may not recommend any screening for nonpregnant women who are considered low risk for pelvic cancer and who do not have symptoms. Ask your health care provider if a screening pelvic exam is right for  you.  If you have had past treatment for cervical cancer or a condition that could lead to cancer, you need Pap tests and screening for cancer for at least 20 years after your treatment. If Pap tests have been discontinued, your risk factors (such as having a new sexual partner) need to be reassessed to determine if screening should resume. Some women have medical problems that increase the chance of getting cervical cancer. In these cases, your health care provider may recommend more frequent screening and Pap tests.  Colorectal cancer can be detected and often prevented. Most routine colorectal cancer screening begins at the age of 50 years and continues through age 75 years. However, your health care provider may recommend screening at an earlier age if you have risk factors for colon cancer. On a yearly basis, your health care provider may provide  home test kits to check for hidden blood in the stool. Use of a small camera at the end of a tube, to directly examine the colon (sigmoidoscopy or colonoscopy), can detect the earliest forms of colorectal cancer. Talk to your health care provider about this at age 50, when routine screening begins. Direct exam of the colon should be repeated every 5-10 years through age 75 years, unless early forms of precancerous polyps or small growths are found.  People who are at an increased risk for hepatitis B should be screened for this virus. You are considered at high risk for hepatitis B if:  You were born in a country where hepatitis B occurs often. Talk with your health care provider about which countries are considered high risk.  Your parents were born in a high-risk country and you have not received a shot to protect against hepatitis B (hepatitis B vaccine).  You have HIV or AIDS.  You use needles to inject street drugs.  You live with, or have sex with, someone who has hepatitis B.  You get hemodialysis treatment.  You take certain medicines for conditions like cancer, organ transplantation, and autoimmune conditions.  Hepatitis C blood testing is recommended for all people born from 1945 through 1965 and any individual with known risks for hepatitis C.  Practice safe sex. Use condoms and avoid high-risk sexual practices to reduce the spread of sexually transmitted infections (STIs). STIs include gonorrhea, chlamydia, syphilis, trichomonas, herpes, HPV, and human immunodeficiency virus (HIV). Herpes, HIV, and HPV are viral illnesses that have no cure. They can result in disability, cancer, and death.  You should be screened for sexually transmitted illnesses (STIs) including gonorrhea and chlamydia if:  You are sexually active and are younger than 24 years.  You are older than 24 years and your health care provider tells you that you are at risk for this type of infection.  Your sexual  activity has changed since you were last screened and you are at an increased risk for chlamydia or gonorrhea. Ask your health care provider if you are at risk.  If you are at risk of being infected with HIV, it is recommended that you take a prescription medicine daily to prevent HIV infection. This is called preexposure prophylaxis (PrEP). You are considered at risk if:  You are sexually active and do not regularly use condoms or know the HIV status of your partner(s).  You take drugs by injection.  You are sexually active with a partner who has HIV.  Talk with your health care provider about whether you are at high risk of being infected with HIV. If   you choose to begin PrEP, you should first be tested for HIV. You should then be tested every 3 months for as long as you are taking PrEP.  Osteoporosis is a disease in which the bones lose minerals and strength with aging. This can result in serious bone fractures or breaks. The risk of osteoporosis can be identified using a bone density scan. Women ages 1 years and over and women at risk for fractures or osteoporosis should discuss screening with their health care providers. Ask your health care provider whether you should take a calcium supplement or vitamin D to reduce the rate of osteoporosis.  Menopause can be associated with physical symptoms and risks. Hormone replacement therapy is available to decrease symptoms and risks. You should talk to your health care provider about whether hormone replacement therapy is right for you.  Use sunscreen. Apply sunscreen liberally and repeatedly throughout the day. You should seek shade when your shadow is shorter than you. Protect yourself by wearing long sleeves, pants, a wide-brimmed hat, and sunglasses year round, whenever you are outdoors.  Once a month, do a whole body skin exam, using a mirror to look at the skin on your back. Tell your health care provider of new moles, moles that have irregular  borders, moles that are larger than a pencil eraser, or moles that have changed in shape or color.  Stay current with required vaccines (immunizations).  Influenza vaccine. All adults should be immunized every year.  Tetanus, diphtheria, and acellular pertussis (Td, Tdap) vaccine. Pregnant women should receive 1 dose of Tdap vaccine during each pregnancy. The dose should be obtained regardless of the length of time since the last dose. Immunization is preferred during the 27th-36th week of gestation. An adult who has not previously received Tdap or who does not know her vaccine status should receive 1 dose of Tdap. This initial dose should be followed by tetanus and diphtheria toxoids (Td) booster doses every 10 years. Adults with an unknown or incomplete history of completing a 3-dose immunization series with Td-containing vaccines should begin or complete a primary immunization series including a Tdap dose. Adults should receive a Td booster every 10 years.  Varicella vaccine. An adult without evidence of immunity to varicella should receive 2 doses or a second dose if she has previously received 1 dose. Pregnant females who do not have evidence of immunity should receive the first dose after pregnancy. This first dose should be obtained before leaving the health care facility. The second dose should be obtained 4-8 weeks after the first dose.  Human papillomavirus (HPV) vaccine. Females aged 13-26 years who have not received the vaccine previously should obtain the 3-dose series. The vaccine is not recommended for use in pregnant females. However, pregnancy testing is not needed before receiving a dose. If a female is found to be pregnant after receiving a dose, no treatment is needed. In that case, the remaining doses should be delayed until after the pregnancy. Immunization is recommended for any person with an immunocompromised condition through the age of 24 years if she did not get any or all doses  earlier. During the 3-dose series, the second dose should be obtained 4-8 weeks after the first dose. The third dose should be obtained 24 weeks after the first dose and 16 weeks after the second dose.  Zoster vaccine. One dose is recommended for adults aged 97 years or older unless certain conditions are present.  Measles, mumps, and rubella (MMR) vaccine. Adults born  before 1957 generally are considered immune to measles and mumps. Adults born in [birth year] or later should have 1 or more doses of MMR vaccine unless there is a contraindication to the vaccine or there is laboratory evidence of immunity to each of the three diseases. A routine second dose of MMR vaccine should be obtained at least 28 days after the first dose for students attending postsecondary schools, health care workers, or international travelers. People who received inactivated measles vaccine or an unknown type of measles vaccine during 1963-1967 should receive 2 doses of MMR vaccine. People who received inactivated mumps vaccine or an unknown type of mumps vaccine before 1979 and are at high risk for mumps infection should consider immunization with 2 doses of MMR vaccine. For females of childbearing age, rubella immunity should be determined. If there is no evidence of immunity, females who are not pregnant should be vaccinated. If there is no evidence of immunity, females who are pregnant should delay immunization until after pregnancy. Unvaccinated health care workers born before 60 who lack laboratory evidence of measles, mumps, or rubella immunity or laboratory confirmation of disease should consider measles and mumps immunization with 2 doses of MMR vaccine or rubella immunization with 1 dose of MMR vaccine.  Pneumococcal 13-valent conjugate (PCV13) vaccine. When indicated, a person who is uncertain of his immunization history and has no record of immunization should receive the PCV13 vaccine. All adults 61 years of age and older  should receive this vaccine. An adult aged 92 years or older who has certain medical conditions and has not been previously immunized should receive 1 dose of PCV13 vaccine. This PCV13 should be followed with a dose of pneumococcal polysaccharide (PPSV23) vaccine. Adults who are at high risk for pneumococcal disease should obtain the PPSV23 vaccine at least 8 weeks after the dose of PCV13 vaccine. Adults older than 21 years of age who have normal immune system function should obtain the PPSV23 vaccine dose at least 1 year after the dose of PCV13 vaccine.  Pneumococcal polysaccharide (PPSV23) vaccine. When PCV13 is also indicated, PCV13 should be obtained first. All adults aged 2 years and older should be immunized. An adult younger than age 30 years who has certain medical conditions should be immunized. Any person who resides in a nursing home or long-term care facility should be immunized. An adult smoker should be immunized. People with an immunocompromised condition and certain other conditions should receive both PCV13 and PPSV23 vaccines. People with human immunodeficiency virus (HIV) infection should be immunized as soon as possible after diagnosis. Immunization during chemotherapy or radiation therapy should be avoided. Routine use of PPSV23 vaccine is not recommended for American Indians, [place] Natives, or people younger than 65 years unless there are medical conditions that require PPSV23 vaccine. When indicated, people who have unknown immunization and have no record of immunization should receive PPSV23 vaccine. One-time revaccination 5 years after the first dose of PPSV23 is recommended for people aged 19-64 years who have chronic kidney failure, nephrotic syndrome, asplenia, or immunocompromised conditions. People who received 1-2 doses of PPSV23 before age 44 years should receive another dose of PPSV23 vaccine at age 83 years or later if at least 5 years have passed since the previous dose. Doses  of PPSV23 are not needed for people immunized with PPSV23 at or after age 20 years.  Meningococcal vaccine. Adults with asplenia or persistent complement component deficiencies should receive 2 doses of quadrivalent meningococcal conjugate (MenACWY-D) vaccine. The doses should be obtained  at least 2 months apart. Microbiologists working with certain meningococcal bacteria, [place] recruits, people at risk during an outbreak, and people who travel to or live in countries with a high rate of meningitis should be immunized. A first-year college student up through age 28 years who is living in a residence hall should receive a dose if she did not receive a dose on or after her 16th birthday. Adults who have certain high-risk conditions should receive one or more doses of vaccine.  Hepatitis A vaccine. Adults who wish to be protected from this disease, have certain high-risk conditions, work with hepatitis A-infected animals, work in hepatitis A research labs, or travel to or work in countries with a high rate of hepatitis A should be immunized. Adults who were previously unvaccinated and who anticipate close contact with an international adoptee during the first 60 days after arrival in the [place] from a country with a high rate of hepatitis A should be immunized.  Hepatitis B vaccine. Adults who wish to be protected from this disease, have certain high-risk conditions, may be exposed to blood or other infectious body fluids, are household contacts or sex partners of hepatitis B positive people, are clients or workers in certain care facilities, or travel to or work in countries with a high rate of hepatitis B should be immunized.  Haemophilus influenzae type b (Hib) vaccine. A previously unvaccinated person with asplenia or sickle cell disease or having a scheduled splenectomy should receive 1 dose of Hib vaccine. Regardless of previous immunization, a recipient of a hematopoietic stem cell transplant  should receive a 3-dose series 6-12 months after her successful transplant. Hib vaccine is not recommended for adults with HIV infection. Preventive Services / Frequency Ages 71 to 87 years  Blood pressure check.** / Every 3-5 years.  Lipid and cholesterol check.** / Every 5 years beginning at age 1.  Clinical breast exam.** / Every 3 years for women in their 3s and 31s.  BRCA-related cancer risk assessment.** / For women who have family members with a BRCA-related cancer (breast, ovarian, tubal, or peritoneal cancers).  Pap test.** / Every 2 years from ages 50 through 86. Every 3 years starting at age 87 through age 7 or 75 with a history of 3 consecutive normal Pap tests.  HPV screening.** / Every 3 years from ages 59 through ages 35 to 6 with a history of 3 consecutive normal Pap tests.  Hepatitis C blood test.** / For any individual with known risks for hepatitis C.  Skin self-exam. / Monthly.  Influenza vaccine. / Every year.  Tetanus, diphtheria, and acellular pertussis (Tdap, Td) vaccine.** / Consult your health care provider. Pregnant women should receive 1 dose of Tdap vaccine during each pregnancy. 1 dose of Td every 10 years.  Varicella vaccine.** / Consult your health care provider. Pregnant females who do not have evidence of immunity should receive the first dose after pregnancy.  HPV vaccine. / 3 doses over 6 months, if 72 and younger. The vaccine is not recommended for use in pregnant females. However, pregnancy testing is not needed before receiving a dose.  Measles, mumps, rubella (MMR) vaccine.** / You need at least 1 dose of MMR if you were born in 1957 or later. You may also need a 2nd dose. For females of childbearing age, rubella immunity should be determined. If there is no evidence of immunity, females who are not pregnant should be vaccinated. If there is no evidence of immunity, females who are  pregnant should delay immunization until after  pregnancy.  Pneumococcal 13-valent conjugate (PCV13) vaccine.** / Consult your health care provider.  Pneumococcal polysaccharide (PPSV23) vaccine.** / 1 to 2 doses if you smoke cigarettes or if you have certain conditions.  Meningococcal vaccine.** / 1 dose if you are age 87 to 44 years and a Market researcher living in a residence hall, or have one of several medical conditions, you need to get vaccinated against meningococcal disease. You may also need additional booster doses.  Hepatitis A vaccine.** / Consult your health care provider.  Hepatitis B vaccine.** / Consult your health care provider.  Haemophilus influenzae type b (Hib) vaccine.** / Consult your health care provider. Ages 86 to 38 years  Blood pressure check.** / Every year.  Lipid and cholesterol check.** / Every 5 years beginning at age 49 years.  Lung cancer screening. / Every year if you are aged 71-80 years and have a 30-pack-year history of smoking and currently smoke or have quit within the past 15 years. Yearly screening is stopped once you have quit smoking for at least 15 years or develop a health problem that would prevent you from having lung cancer treatment.  Clinical breast exam.** / Every year after age 51 years.  BRCA-related cancer risk assessment.** / For women who have family members with a BRCA-related cancer (breast, ovarian, tubal, or peritoneal cancers).  Mammogram.** / Every year beginning at age 18 years and continuing for as long as you are in good health. Consult with your health care provider.  Pap test.** / Every 3 years starting at age 63 years through age 37 or 57 years with a history of 3 consecutive normal Pap tests.  HPV screening.** / Every 3 years from ages 41 years through ages 76 to 23 years with a history of 3 consecutive normal Pap tests.  Fecal occult blood test (FOBT) of stool. / Every year beginning at age 36 years and continuing until age 51 years. You may not need  to do this test if you get a colonoscopy every 10 years.  Flexible sigmoidoscopy or colonoscopy.** / Every 5 years for a flexible sigmoidoscopy or every 10 years for a colonoscopy beginning at age 36 years and continuing until age 35 years.  Hepatitis C blood test.** / For all people born from [birth year] through 1965 and any individual with known risks for hepatitis C.  Skin self-exam. / Monthly.  Influenza vaccine. / Every year.  Tetanus, diphtheria, and acellular pertussis (Tdap/Td) vaccine.** / Consult your health care provider. Pregnant women should receive 1 dose of Tdap vaccine during each pregnancy. 1 dose of Td every 10 years.  Varicella vaccine.** / Consult your health care provider. Pregnant females who do not have evidence of immunity should receive the first dose after pregnancy.  Zoster vaccine.** / 1 dose for adults aged 73 years or older.  Measles, mumps, rubella (MMR) vaccine.** / You need at least 1 dose of MMR if you were born in 1957 or later. You may also need a second dose. For females of childbearing age, rubella immunity should be determined. If there is no evidence of immunity, females who are not pregnant should be vaccinated. If there is no evidence of immunity, females who are pregnant should delay immunization until after pregnancy.  Pneumococcal 13-valent conjugate (PCV13) vaccine.** / Consult your health care provider.  Pneumococcal polysaccharide (PPSV23) vaccine.** / 1 to 2 doses if you smoke cigarettes or if you have certain conditions.  Meningococcal vaccine.** /  Consult your health care provider.  Hepatitis A vaccine.** / Consult your health care provider.  Hepatitis B vaccine.** / Consult your health care provider.  Haemophilus influenzae type b (Hib) vaccine.** / Consult your health care provider. Ages 80 years and over  Blood pressure check.** / Every year.  Lipid and cholesterol check.** / Every 5 years beginning at age 62 years.  Lung cancer  screening. / Every year if you are aged 32-80 years and have a 30-pack-year history of smoking and currently smoke or have quit within the past 15 years. Yearly screening is stopped once you have quit smoking for at least 15 years or develop a health problem that would prevent you from having lung cancer treatment.  Clinical breast exam.** / Every year after age 61 years.  BRCA-related cancer risk assessment.** / For women who have family members with a BRCA-related cancer (breast, ovarian, tubal, or peritoneal cancers).  Mammogram.** / Every year beginning at age 39 years and continuing for as long as you are in good health. Consult with your health care provider.  Pap test.** / Every 3 years starting at age 85 years through age 74 or 72 years with 3 consecutive normal Pap tests. Testing can be stopped between 65 and 70 years with 3 consecutive normal Pap tests and no abnormal Pap or HPV tests in the past 10 years.  HPV screening.** / Every 3 years from ages 55 years through ages 67 or 77 years with a history of 3 consecutive normal Pap tests. Testing can be stopped between 65 and 70 years with 3 consecutive normal Pap tests and no abnormal Pap or HPV tests in the past 10 years.  Fecal occult blood test (FOBT) of stool. / Every year beginning at age 81 years and continuing until age 22 years. You may not need to do this test if you get a colonoscopy every 10 years.  Flexible sigmoidoscopy or colonoscopy.** / Every 5 years for a flexible sigmoidoscopy or every 10 years for a colonoscopy beginning at age 67 years and continuing until age 22 years.  Hepatitis C blood test.** / For all people born from [birth year] through 1966 and any individual with known risks for hepatitis C.  Osteoporosis screening.** / A one-time screening for women ages 8 years and over and women at risk for fractures or osteoporosis.  Skin self-exam. / Monthly.  Influenza vaccine. / Every year.  Tetanus, diphtheria, and  acellular pertussis (Tdap/Td) vaccine.** / 1 dose of Td every 10 years.  Varicella vaccine.** / Consult your health care provider.  Zoster vaccine.** / 1 dose for adults aged 56 years or older.  Pneumococcal 13-valent conjugate (PCV13) vaccine.** / Consult your health care provider.  Pneumococcal polysaccharide (PPSV23) vaccine.** / 1 dose for all adults aged 15 years and older.  Meningococcal vaccine.** / Consult your health care provider.  Hepatitis A vaccine.** / Consult your health care provider.  Hepatitis B vaccine.** / Consult your health care provider.  Haemophilus influenzae type b (Hib) vaccine.** / Consult your health care provider. ** Family history and personal history of risk and conditions may change your health care provider's recommendations.   This information is not intended to replace advice given to you by your health care provider. Make sure you discuss any questions you have with your health care provider.   Document Released: 10/30/2001 Document Revised: 09/24/2014 Document Reviewed: 01/29/2011 Elsevier Interactive Patient Education [employer].

## 2016-07-05 NOTE — Progress Notes (Signed)
  Subjective:     Caroline Gonzalez is a 20 y.o. female and is here for a comprehensive physical exam. The patient reports bleeding less than previous visit..  Social History   Social History  . Marital status: Single    Spouse name: N/A  . Number of children: N/A  . Years of education: N/A   Occupational History  . Not on file.   Social History Main Topics  . Smoking status: Former Smoker    Packs/day: 0.25    Types: Cigarettes  . Smokeless tobacco: Never Used  . Alcohol use No  . Drug use: No     Comment: THC--recently quit  . Sexual activity: Yes    Birth control/ protection: IUD     Comment: mirena   Other Topics Concern  . Not on file   Social History Narrative  . No narrative on file   Health Maintenance  Topic Date Due  . CHLAMYDIA SCREENING  07/28/2010  . HIV Screening  07/28/2010  . TETANUS/TDAP  07/28/2014  . INFLUENZA VACCINE  Completed    The following portions of the patient's history were reviewed and updated as appropriate: allergies, current medications, past family history, past medical history, past social history, past surgical history and problem list.  Review of Systems A comprehensive review of systems was negative.   Objective:    General appearance: alert, cooperative and appears stated age Neck: no adenopathy, no carotid bruit, no JVD, supple, symmetrical, trachea midline and thyroid not enlarged, symmetric, no tenderness/mass/nodules Lungs: clear to auscultation bilaterally Breasts: not examined Heart: regular rate and rhythm, S1, S2 normal, no murmur, click, rub or gallop Abdomen: soft, non-tender; bowel sounds normal; no masses,  no organomegaly Pelvic: cervix normal in appearance, external genitalia normal, no adnexal masses or tenderness, no cervical motion tenderness, rectovaginal septum normal, uterus normal size, shape, and consistency, vagina normal without discharge and IUD string visualized in cervix.   Assessment:    Healthy female exam.  IUD Placement Checked     Plan:   Meds refilled   See After Visit Summary for Counseling Recommendations  : Follow up in 1 year for AE or sooner if needed.

## 2016-08-20 ENCOUNTER — Telehealth: Payer: Self-pay | Admitting: Obstetrics and Gynecology

## 2016-08-20 NOTE — Telephone Encounter (Signed)
Patients mother called stating she is still having some anxiety issues and would like to increase her dosage of lexapro if possible.Thanks

## 2016-08-21 NOTE — Telephone Encounter (Signed)
Please have her double it for a week and let me know if that helps, if so I will send in new rx.

## 2016-08-22 NOTE — Telephone Encounter (Signed)
Notified mother she voiced understanding.

## 2016-09-06 ENCOUNTER — Other Ambulatory Visit: Payer: Self-pay | Admitting: *Deleted

## 2016-09-06 MED ORDER — ESCITALOPRAM OXALATE 20 MG PO TABS: ORAL_TABLET | ORAL | 1 refills | Status: DC

## 2017-02-04 DIAGNOSIS — S43432A Superior glenoid labrum lesion of left shoulder, initial encounter: Secondary | ICD-10-CM | POA: Diagnosis not present

## 2017-02-04 DIAGNOSIS — S93432A Sprain of tibiofibular ligament of left ankle, initial encounter: Secondary | ICD-10-CM | POA: Diagnosis not present

## 2017-02-18 DIAGNOSIS — S93432A Sprain of tibiofibular ligament of left ankle, initial encounter: Secondary | ICD-10-CM | POA: Diagnosis not present

## 2017-05-06 ENCOUNTER — Other Ambulatory Visit: Payer: Self-pay | Admitting: Obstetrics and Gynecology

## 2017-07-11 ENCOUNTER — Encounter: Payer: BLUE CROSS/BLUE SHIELD | Admitting: Obstetrics and Gynecology

## 2017-07-19 ENCOUNTER — Encounter: Payer: Self-pay | Admitting: Obstetrics and Gynecology

## 2017-07-19 ENCOUNTER — Ambulatory Visit (INDEPENDENT_AMBULATORY_CARE_PROVIDER_SITE_OTHER): Payer: BLUE CROSS/BLUE SHIELD | Admitting: Obstetrics and Gynecology

## 2017-07-19 ENCOUNTER — Other Ambulatory Visit: Payer: Self-pay | Admitting: Obstetrics and Gynecology

## 2017-07-19 VITALS — BP 128/79 | HR 73 | Ht 67.0 in | Wt 152.7 lb

## 2017-07-19 DIAGNOSIS — Z01419 Encounter for gynecological examination (general) (routine) without abnormal findings: Secondary | ICD-10-CM | POA: Diagnosis not present

## 2017-07-19 NOTE — Progress Notes (Signed)
   Subjective:     Caroline Gonzalez is a single white 21 y.o. female and is here for a comprehensive physical exam. She lives with long term boyfriend and FOB. Working FT at [employer]. She is sexually active with IUD in place. The patient reports no problems.  Social History   Social History  . Marital status: Single    Spouse name: N/A  . Number of children: N/A  . Years of education: N/A   Occupational History  . Not on file.   Social History Main Topics  . Smoking status: Former Smoker    Packs/day: 0.25    Types: Cigarettes  . Smokeless tobacco: Never Used  . Alcohol use No  . Drug use: No     Comment: THC--recently quit  . Sexual activity: Yes    Birth control/ protection: IUD     Comment: mirena   Other Topics Concern  . Not on file   Social History Narrative  . No narrative on file   Health Maintenance  Topic Date Due  . CHLAMYDIA SCREENING  07/28/2010  . HIV Screening  07/28/2010  . TETANUS/TDAP  07/28/2014  . PAP SMEAR  07/28/2016  . INFLUENZA VACCINE  04/17/2017    The following portions of the patient's history were reviewed and updated as appropriate: allergies, current medications, past family history, past medical history, past social history, past surgical history and problem list.  Review of Systems Pertinent items noted in HPI and remainder of comprehensive ROS otherwise negative.   Objective:    General appearance: alert, cooperative and appears stated age Neck: no adenopathy, no carotid bruit, no JVD, supple, symmetrical, trachea midline and thyroid not enlarged, symmetric, no tenderness/mass/nodules Lungs: clear to auscultation bilaterally Breasts: normal appearance, no masses or tenderness Heart: regular rate and rhythm, S1, S2 normal, no murmur, click, rub or gallop Abdomen: soft, non-tender; bowel sounds normal; no masses,  no organomegaly Pelvic: cervix normal in appearance, external genitalia normal, no adnexal masses or  tenderness, no cervical motion tenderness, rectovaginal septum normal, uterus normal size, shape, and consistency, vagina normal without discharge and IUD string noted    Assessment:    Healthy female exam. IUD check     Plan:  RTC 1 year or as needed.  Melody Shambley,CNM   See After Visit Summary for Counseling Recommendations

## 2017-07-20 LAB — COMPREHENSIVE METABOLIC PANEL
ALBUMIN: 5 g/dL (ref 3.5–5.5)
ALK PHOS: 65 IU/L (ref 39–117)
ALT: 12 IU/L (ref 0–32)
AST: 21 IU/L (ref 0–40)
Albumin/Globulin Ratio: 2.2 (ref 1.2–2.2)
BILIRUBIN TOTAL: 0.5 mg/dL (ref 0.0–1.2)
BUN / CREAT RATIO: 16 (ref 9–23)
BUN: 13 mg/dL (ref 6–20)
CHLORIDE: 104 mmol/L (ref 96–106)
CO2: 26 mmol/L (ref 20–29)
CREATININE: 0.83 mg/dL (ref 0.57–1.00)
Calcium: 9.7 mg/dL (ref 8.7–10.2)
GFR calc non Af Amer: 101 mL/min/{1.73_m2} (ref >59)
GFR, EST AFRICAN AMERICAN: 117 mL/min/{1.73_m2} (ref >59)
GLUCOSE: 87 mg/dL (ref 65–99)
Globulin, Total: 2.3 g/dL (ref 1.5–4.5)
Potassium: 4.2 mmol/L (ref 3.5–5.2)
Sodium: 144 mmol/L (ref 134–144)
TOTAL PROTEIN: 7.3 g/dL (ref 6.0–8.5)

## 2017-07-20 LAB — CBC
Hematocrit: 40.3 % (ref 34.0–46.6)
Hemoglobin: 13.4 g/dL (ref 11.1–15.9)
MCH: 28.7 pg (ref 26.6–33.0)
MCHC: 33.3 g/dL (ref 31.5–35.7)
MCV: 86 fL (ref 79–97)
PLATELETS: 326 10*3/uL (ref 150–379)
RBC: 4.67 x10E6/uL (ref 3.77–5.28)
RDW: 13.1 % (ref 12.3–15.4)
WBC: 6.5 10*3/uL (ref 3.4–10.8)

## 2017-07-23 LAB — CYTOLOGY - PAP

## 2017-10-16 DIAGNOSIS — R0981 Nasal congestion: Secondary | ICD-10-CM | POA: Diagnosis not present

## 2017-10-16 DIAGNOSIS — J069 Acute upper respiratory infection, unspecified: Secondary | ICD-10-CM | POA: Diagnosis not present

## 2017-10-28 ENCOUNTER — Ambulatory Visit: Payer: Self-pay | Admitting: Medical

## 2017-10-28 VITALS — BP 106/70 | HR 70 | Temp 98.7°F | Resp 16 | Ht 67.0 in | Wt 149.0 lb

## 2017-10-28 DIAGNOSIS — R059 Cough, unspecified: Secondary | ICD-10-CM

## 2017-10-28 DIAGNOSIS — R062 Wheezing: Secondary | ICD-10-CM

## 2017-10-28 DIAGNOSIS — J40 Bronchitis, not specified as acute or chronic: Secondary | ICD-10-CM

## 2017-10-28 DIAGNOSIS — R05 Cough: Secondary | ICD-10-CM

## 2017-10-28 DIAGNOSIS — J019 Acute sinusitis, unspecified: Secondary | ICD-10-CM

## 2017-10-28 MED ORDER — AZITHROMYCIN 250 MG PO TABS: ORAL_TABLET | ORAL | 0 refills | Status: DC

## 2017-10-28 MED ORDER — BENZONATATE 100 MG PO CAPS: 100.0000 mg | ORAL_CAPSULE | Freq: Three times a day (TID) | ORAL | 0 refills | Status: DC | PRN

## 2017-10-28 MED ORDER — PREDNISONE 10 MG (21) PO TBPK: ORAL_TABLET | ORAL | 0 refills | Status: DC

## 2017-10-28 MED ORDER — ALBUTEROL SULFATE HFA 108 (90 BASE) MCG/ACT IN AERS: 2.0000 | INHALATION_SPRAY | Freq: Four times a day (QID) | RESPIRATORY_TRACT | 0 refills | Status: DC | PRN

## 2017-10-28 NOTE — Progress Notes (Signed)
   Subjective:    Patient ID: Caroline Gonzalez, female    DOB: [birth year], 22 y.o.   MRN: [id number]  HPI 22 yo female in non acute distress. Started 3 weeks ago,with sore throat and headaches initially, seen by walk in clinic doctor and told to take Mucinex . fells like it now is in her chest, cough productive and green, nasal dischare is clean. Marland Kitchen. Has heard some wheezing.  22 yo child with otitis media and some broncho restriction placed on Amoxil and Prednisone.  Review of Systems  Constitutional: Negative for fever.  HENT: Positive for congestion, postnasal drip, sinus pressure, sinus pain, sneezing, sore throat (with coughing) and tinnitus (sometimes).   Respiratory: Positive for cough, chest tightness and shortness of breath.   Gastrointestinal: Negative for abdominal pain.  Genitourinary: Negative for dysuria.  Musculoskeletal: Negative for myalgias.  Skin: Negative for rash.  Neurological: Negative for dizziness and syncope.  Hematological: Negative for adenopathy.  Psychiatric/Behavioral: Negative for behavioral problems, self-injury and suicidal ideas. The patient is nervous/anxious (takes Lexapro no new symptoms).        Objective:   Physical Exam  Constitutional: She is oriented to person, place, and time. She appears well-developed and well-nourished.  HENT:  Head: Normocephalic and atraumatic.  Right Ear: External ear normal.  Left Ear: External ear normal.  Mouth/Throat: Oropharynx is clear and moist.  Eyes: Conjunctivae and EOM are normal. Pupils are equal, round, and reactive to light.  Neck: Normal range of motion. Neck supple.  Cardiovascular: Normal rate, regular rhythm and normal heart sounds.  Pulmonary/Chest: Effort normal and breath sounds normal.  Musculoskeletal: Normal range of motion.  Lymphadenopathy:    She has no cervical adenopathy.  Neurological: She is alert and oriented to person, place, and time.  Skin: Skin is warm and dry.  Psychiatric:  She has a normal mood and affect. Her behavior is normal. Judgment and thought content normal.  Nursing note and vitals reviewed.         Assessment & Plan:  Bronchitis, cough , wheezing. Meds ordered this encounter  Medications  . azithromycin (ZITHROMAX Z-PAK) 250 MG tablet    Sig: Take two tablets by mouth Day 1 then one tablet days  2-5 , take with food    Dispense:  6 each    Refill:  0  . benzonatate (TESSALON PERLES) 100 MG capsule    Sig: Take 1 capsule (100 mg total) by mouth 3 (three) times daily as needed.    Dispense:  30 capsule    Refill:  0  . albuterol (PROVENTIL HFA;VENTOLIN HFA) 108 (90 Base) MCG/ACT inhaler    Sig: Inhale 2 puffs into the lungs every 6 (six) hours as needed for wheezing or shortness of breath.    Dispense:  1 Inhaler    Refill:  0  . predniSONE (STERAPRED UNI-PAK 21 TAB) 10 MG (21) TBPK tablet    Sig: Take 6 tablets by mouth today and 5 tablet tomorrow then one tablet less each day thereafter. Take with food.    Dispense:  21 tablet    Refill:  0  Return in 3-5 days if not improving. Patient verbalizes understanding and has no questions at discharge.

## 2017-10-28 NOTE — Patient Instructions (Signed)
Bronchitis  Cough, Adult A cough helps to clear your throat and lungs. A cough may last only 2-3 weeks (acute), or it may last longer than 8 weeks (chronic). Many different things can cause a cough. A cough may be a sign of an illness or another medical condition. Follow these instructions at home:  Pay attention to any changes in your cough.  Take medicines only as told by your doctor. ? If you were prescribed an antibiotic medicine, take it as told by your doctor. Do not stop taking it even if you start to feel better. ? Talk with your doctor before you try using a cough medicine.  Drink enough fluid to keep your pee (urine) clear or pale yellow.  If the air is dry, use a cold steam vaporizer or humidifier in your home.  Stay away from things that make you cough at work or at home.  If your cough is worse at night, try using extra pillows to raise your head up higher while you sleep.  Do not smoke, and try not to be around smoke. If you need help quitting, ask your doctor.  Do not have caffeine.  Do not drink alcohol.  Rest as needed. Contact a doctor if:  You have new problems (symptoms).  You cough up yellow fluid (pus).  Your cough does not get better after 2-3 weeks, or your cough gets worse.  Medicine does not help your cough and you are not sleeping well.  You have pain that gets worse or pain that is not helped with medicine.  You have a fever.  You are losing weight and you do not know why.  You have night sweats. Get help right away if:  You cough up blood.  You have trouble breathing.  Your heartbeat is very fast. This information is not intended to replace advice given to you by your health care provider. Make sure you discuss any questions you have with your health care provider. Document Released: 05/17/2011 Document Revised: 02/09/2016 Document Reviewed: 11/10/2014 Elsevier Interactive Patient Education  2018 [employer].  Acute Bronchitis,  Adult Acute bronchitis is when air tubes (bronchi) in the lungs suddenly get swollen. The condition can make it hard to breathe. It can also cause these symptoms:  A cough.  Coughing up clear, yellow, or green mucus.  Wheezing.  Chest congestion.  Shortness of breath.  A fever.  Body aches.  Chills.  A sore throat.  Follow these instructions at home: Medicines  Take over-the-counter and prescription medicines only as told by your doctor.  If you were prescribed an antibiotic medicine, take it as told by your doctor. Do not stop taking the antibiotic even if you start to feel better. General instructions  Rest.  Drink enough fluids to keep your pee (urine) clear or pale yellow.  Avoid smoking and secondhand smoke. If you smoke and you need help quitting, ask your doctor. Quitting will help your lungs heal faster.  Use an inhaler, cool mist vaporizer, or humidifier as told by your doctor.  Keep all follow-up visits as told by your doctor. This is important. How is this prevented? To lower your risk of getting this condition again:  Wash your hands often with soap and water. If you cannot use soap and water, use hand sanitizer.  Avoid contact with people who have cold symptoms.  Try not to touch your hands to your mouth, nose, or eyes.  Make sure to get the flu shot every year.  Contact a doctor if:  Your symptoms do not get better in 2 weeks. Get help right away if:  You cough up blood.  You have chest pain.  You have very bad shortness of breath.  You become dehydrated.  You faint (pass out) or keep feeling like you are going to pass out.  You keep throwing up (vomiting).  You have a very bad headache.  Your fever or chills gets worse. This information is not intended to replace advice given to you by your health care provider. Make sure you discuss any questions you have with your health care provider. Document Released: 02/20/2008 Document  Revised: 04/11/2016 Document Reviewed: 02/22/2016 Elsevier Interactive Patient Education  [employer]2018 [employer]

## 2017-12-27 ENCOUNTER — Other Ambulatory Visit: Payer: Self-pay | Admitting: Obstetrics and Gynecology

## 2018-04-03 ENCOUNTER — Encounter: Payer: Self-pay | Admitting: Internal Medicine

## 2018-04-03 ENCOUNTER — Ambulatory Visit: Payer: BLUE CROSS/BLUE SHIELD | Admitting: Internal Medicine

## 2018-04-03 DIAGNOSIS — F329 Major depressive disorder, single episode, unspecified: Secondary | ICD-10-CM | POA: Diagnosis not present

## 2018-04-03 DIAGNOSIS — F32A Depression, unspecified: Secondary | ICD-10-CM | POA: Insufficient documentation

## 2018-04-03 DIAGNOSIS — F419 Anxiety disorder, unspecified: Secondary | ICD-10-CM | POA: Insufficient documentation

## 2018-04-03 MED ORDER — HYDROXYZINE HCL 10 MG PO TABS: 10.0000 mg | ORAL_TABLET | Freq: Every day | ORAL | 0 refills | Status: DC | PRN

## 2018-04-03 NOTE — Progress Notes (Signed)
Subjective:    Patient ID: Caroline Gonzalez, female    DOB: Feb 09, 1996, 22 y.o.   MRN: [id number]  HPI  Pt presents to the clinic today to establish care. She is transferring care from Dr. Dayton Martes but has not been seen since 2015.  Anxiety and Depression: Triggered by death of her aunt. She feels like this is worse recently. She is taking Lexapro as prescribed. She feels like her works well for her depression but not for her anxiety. She would like something to take as needed for her anxiety. She reports panic attacks 2-3 times a day now. She denies SI/HI. She has seen psychiatrist and a therapist in the past. She is currently looking for a therapist in the area.  Flu: 06/2017 Tetanus: 2008 Pap Smear: 07/2017 Dentist: biannually   Review of Systems      Past Medical History:  Diagnosis Date  . Anxiety   . Anxiety and depression   . Contraception   . Depression   . Sleep disturbance     Current Outpatient Medications  Medication Sig Dispense Refill  . albuterol (PROVENTIL HFA;VENTOLIN HFA) 108 (90 Base) MCG/ACT inhaler Inhale 2 puffs into the lungs every 6 (six) hours as needed for wheezing or shortness of breath. 1 Inhaler 0  . azithromycin (ZITHROMAX Z-PAK) 250 MG tablet Take two tablets by mouth Day 1 then one tablet days  2-5 , take with food 6 each 0  . benzonatate (TESSALON PERLES) 100 MG capsule Take 1 capsule (100 mg total) by mouth 3 (three) times daily as needed. 30 capsule 0  . escitalopram (LEXAPRO) 20 MG tablet TAKE 1 TABLET BY MOUTH ONCE DAILY 90 tablet 1  . Guaifenesin 1200 MG TB12 Take by mouth.    . levonorgestrel (MIRENA) 20 MCG/24HR IUD 1 Intra Uterine Device (1 each total) by Intrauterine route once. 1 each 0  . predniSONE (STERAPRED UNI-PAK 21 TAB) 10 MG (21) TBPK tablet Take 6 tablets by mouth today and 5 tablet tomorrow then one tablet less each day thereafter. Take with food. 21 tablet 0   No current facility-administered medications for this visit.       Allergies  Allergen Reactions  . Codeine     REACTION: hallucinations  . Morphine     REACTION: hallucinations  . Sulfa Antibiotics   . Tamiflu [Oseltamivir Phosphate]     Family History  Problem Relation Age of Onset  . Diabetes Paternal Grandfather   . Cancer Paternal Grandmother        BREAST  . Cancer Paternal Aunt        BREAST    Social History   Socioeconomic History  . Marital status: Single    Spouse name: Not on file  . Number of children: Not on file  . Years of education: Not on file  . Highest education level: Not on file  Occupational History  . Not on file  Social Needs  . Financial resource strain: Not on file  . Food insecurity:    Worry: Not on file    Inability: Not on file  . Transportation needs:    Medical: Not on file    Non-medical: Not on file  Tobacco Use  . Smoking status: Former Smoker    Packs/day: 0.25    Types: Cigarettes  . Smokeless tobacco: Never Used  Substance and Sexual Activity  . Alcohol use: No    Alcohol/week: 0.0 oz  . Drug use: No    Comment: THC--recently  quit  . Sexual activity: Yes    Birth control/protection: IUD    Comment: mirena  Lifestyle  . Physical activity:    Days per week: Not on file    Minutes per session: Not on file  . Stress: Not on file  Relationships  . Social connections:    Talks on phone: Not on file    Gets together: Not on file    Attends religious service: Not on file    Active member of club or organization: Not on file    Attends meetings of clubs or organizations: Not on file    Relationship status: Not on file  . Intimate partner violence:    Fear of current or ex partner: Not on file    Emotionally abused: Not on file    Physically abused: Not on file    Forced sexual activity: Not on file  Other Topics Concern  . Not on file  Social History Narrative  . Not on file     Constitutional: Denies fever, malaise, fatigue, headache or abrupt weight changes.  HEENT:  Denies eye pain, eye redness, ear pain, ringing in the ears, wax buildup, runny nose, nasal congestion, bloody nose, or sore throat. Respiratory: Denies difficulty breathing, shortness of breath, cough or sputum production.   Cardiovascular: Denies chest pain, chest tightness, palpitations or swelling in the hands or feet.  Gastrointestinal: Denies abdominal pain, bloating, constipation, diarrhea or blood in the stool.  GU: Denies urgency, frequency, pain with urination, burning sensation, blood in urine, odor or discharge. Musculoskeletal: Denies decrease in range of motion, difficulty with gait, muscle pain or joint pain and swelling.  Skin: Denies redness, rashes, lesions or ulcercations.  Neurological: Denies dizziness, difficulty with memory, difficulty with speech or problems with balance and coordination.  Psych: Pt has a history of anxiety and depression. Denies  SI/HI.  No other specific complaints in a complete review of systems (except as listed in HPI above).  Objective:   Physical Exam   BP 108/66   Pulse 62   Temp 98.7 F (37.1 C) (Oral)   Ht 5\' 7"  (1.702 m)   Wt 148 lb (67.1 kg)   SpO2 98%   BMI 23.18 kg/m  Wt Readings from Last 3 Encounters:  04/03/18 148 lb (67.1 kg)  10/28/17 149 lb (67.6 kg)  07/19/17 152 lb 11.2 oz (69.3 kg)    General: Appears her stated age, well developed, well nourished in NAD. Skin: Warm, dry and intact. Cardiovascular: Normal rate and rhythm. S1,S2 noted.  No murmur, rubs or gallops noted.  Pulmonary/Chest: Normal effort and positive vesicular breath sounds. No respiratory distress. No wheezes, rales or ronchi noted.  Neurological: Alert and oriented.  Psychiatric: Mood and affect normal. Behavior is normal. Judgment and thought content normal.     BMET    Component Value Date/Time   NA 144 07/19/2017 1704   NA 140 11/30/2012 2156   K 4.2 07/19/2017 1704   K 3.5 11/30/2012 2156   CL 104 07/19/2017 1704   CL 106 11/30/2012 2156    CO2 26 07/19/2017 1704   CO2 28 (H) 11/30/2012 2156   GLUCOSE 87 07/19/2017 1704   GLUCOSE 85 11/30/2012 2156   BUN 13 07/19/2017 1704   BUN 7 (L) 11/30/2012 2156   CREATININE 0.83 07/19/2017 1704   CREATININE 0.61 11/30/2012 2156   CALCIUM 9.7 07/19/2017 1704   CALCIUM 9.1 11/30/2012 2156   GFRNONAA 101 07/19/2017 1704   GFRAA 117 07/19/2017  1704    Lipid Panel  No results found for: CHOL, TRIG, HDL, CHOLHDL, VLDL, LDLCALC  CBC    Component Value Date/Time   WBC 6.5 07/19/2017 1704   WBC 13.9 (H) 09/06/2014 0938   RBC 4.67 07/19/2017 1704   RBC 4.79 09/06/2014 0938   HGB 13.4 07/19/2017 1704   HCT 40.3 07/19/2017 1704   PLT 326 07/19/2017 1704   MCV 86 07/19/2017 1704   MCV 88 09/06/2014 0938   MCH 28.7 07/19/2017 1704   MCH 28.8 09/06/2014 0938   MCHC 33.3 07/19/2017 1704   MCHC 32.6 09/06/2014 0938   RDW 13.1 07/19/2017 1704   RDW 13.1 09/06/2014 0938   LYMPHSABS 2.3 09/06/2014 0938   MONOABS 1.5 (H) 09/06/2014 0938   EOSABS 0.1 09/06/2014 0938   BASOSABS 0.1 09/06/2014 0938    Hgb A1C No results found for: HGBA1C         Assessment & Plan:

## 2018-04-03 NOTE — Assessment & Plan Note (Signed)
Deteriorated with more panic Continue Lexapro eRx for Hydroxyzine 10 mg daily prn- sedation cautions given She is looking for a therapist, declines referral at this time  Advised her to make an appt for an annual exam

## 2018-04-03 NOTE — Patient Instructions (Signed)

## 2018-06-04 ENCOUNTER — Other Ambulatory Visit (INDEPENDENT_AMBULATORY_CARE_PROVIDER_SITE_OTHER): Payer: BLUE CROSS/BLUE SHIELD

## 2018-06-04 ENCOUNTER — Encounter: Payer: Self-pay | Admitting: Obstetrics and Gynecology

## 2018-06-04 ENCOUNTER — Ambulatory Visit (INDEPENDENT_AMBULATORY_CARE_PROVIDER_SITE_OTHER): Payer: BLUE CROSS/BLUE SHIELD | Admitting: Obstetrics and Gynecology

## 2018-06-04 VITALS — BP 135/64 | HR 60 | Ht 67.0 in | Wt 146.7 lb

## 2018-06-04 DIAGNOSIS — N921 Excessive and frequent menstruation with irregular cycle: Secondary | ICD-10-CM

## 2018-06-04 DIAGNOSIS — N83202 Unspecified ovarian cyst, left side: Secondary | ICD-10-CM

## 2018-06-04 DIAGNOSIS — Z30432 Encounter for removal of intrauterine contraceptive device: Secondary | ICD-10-CM | POA: Diagnosis not present

## 2018-06-04 DIAGNOSIS — Z975 Presence of (intrauterine) contraceptive device: Secondary | ICD-10-CM | POA: Diagnosis not present

## 2018-06-04 DIAGNOSIS — N83209 Unspecified ovarian cyst, unspecified side: Secondary | ICD-10-CM

## 2018-06-04 MED ORDER — DESOGESTREL-ETHINYL ESTRADIOL 0.15-0.02/0.01 MG (21/5) PO TABS: 1.0000 | ORAL_TABLET | Freq: Every day | ORAL | 11 refills | Status: DC

## 2018-06-04 NOTE — Progress Notes (Signed)
  Subjective:     Patient ID: Caroline ComesBrandi Tyson Colford, female   DOB: 1996/06/22, 22 y.o.   MRN: [id number]  HPI Here to discuss irregular menses and spotting for last few months. Had Mirena IUD placed 3 years ago and has had similar episodes, with the last one resolved with a short trial of OCPs. Denies pain with intercourse but s=does see spotting. Happy with IUD otherwise and not ready for more children yet. Just graduated from [employer] and looking for a job.   Review of Systems  Genitourinary: Positive for menstrual problem.  All other systems reviewed and are negative.      Objective:   Physical Exam A&Ox4 Well groomed female in no distress Blood pressure 135/64, pulse 60, height 5\' 7"  (1.702 m), weight 146 lb 11.2 oz (66.5 kg). Pelvic exam: normal external genitalia, vulva, vagina, cervix, uterus and adnexa, IUD string palpated. Pelvic ultrasound today reveals:  Findings:  The uterus measures 8.0 x 3.9 x 3.3 cm. Echo texture is homogeneous without evidence of focal masses.  The Endometrium measures 3.3 mm.  The IUD is not in the correct location.  It is located within the LUS/cervix.  Right Ovary measures 3.1 x 2.8 x 2.1 cm. It is normal in appearance. Left Ovary measures 5.7 x 4.2 x 2.7 cm and contains a simple cyst that measures 4.9 x 3.8 x 2.1 cm.  Survey of the adnexa demonstrates no adnexal masses. There is a small amount of free fluid in the posterior cul de sac.    Assessment:     BTB with IUD-malpositioned Left ovarian cysts IUD removal    Plan:     [facility] and [facility], to start tonight and use back up method for 2 weeks.  Counseled on ovarian cysts and will repeat ultrasound in 6 weeks to check for resolution.  Daeron Carreno [place], CNM      Procedure: Caroline ComesBrandi Tyson Weis is a 22 y.o. year old [id number] Caucasian female who presents for removal of a Mirena IUD. Her Mirena IUD was placed 3 years ago.   No LMP recorded. (Menstrual status: IUD). BP  135/64   Pulse 60   Ht 5\' 7"  (1.702 m)   Wt 146 lb 11.2 oz (66.5 kg)   BMI 22.98 kg/m   Time out was performed.  A raves speculum was placed in the vagina.  The cervix was visualized, and the strings were visible. They were grasped and the Mirena was easily removed intact without complications.     [place] [name] Haven Foss, CNM

## 2018-06-04 NOTE — Patient Instructions (Signed)
Ovarian Cyst An ovarian cyst is a fluid-filled sac that forms on an ovary. The ovaries are small organs that produce eggs in women. Various types of cysts can form on the ovaries. Some may cause symptoms and require treatment. Most ovarian cysts go away on their own, are not cancerous (are benign), and do not cause problems. Common types of ovarian cysts include:  Functional (follicle) cysts. ? Occur during the menstrual cycle, and usually go away with the next menstrual cycle if you do not get pregnant. ? Usually cause no symptoms.  Endometriomas. ? Are cysts that form from the tissue that lines the uterus (endometrium). ? Are sometimes called "chocolate cysts" because they become filled with blood that turns brown. ? Can cause pain in the lower abdomen during intercourse and during your period.  Cystadenoma cysts. ? Develop from cells on the outside surface of the ovary. ? Can get very large and cause lower abdomen pain and pain with intercourse. ? Can cause severe pain if they twist or break open (rupture).  Dermoid cysts. ? Are sometimes found in both ovaries. ? May contain different kinds of body tissue, such as skin, teeth, hair, or cartilage. ? Usually do not cause symptoms unless they get very big.  Theca lutein cysts. ? Occur when too much of a certain hormone (human chorionic gonadotropin) is produced and overstimulates the ovaries to produce an egg. ? Are most common after having procedures used to assist with the conception of a baby (in vitro fertilization).  What are the causes? Ovarian cysts may be caused by:  Ovarian hyperstimulation syndrome. This is a condition that can develop from taking fertility medicines. It causes multiple large ovarian cysts to form.  Polycystic ovarian syndrome (PCOS). This is a common hormonal disorder that can cause ovarian cysts, as well as problems with your period or fertility.  What increases the risk? The following factors may make  you more likely to develop ovarian cysts:  Being overweight or obese.  Taking fertility medicines.  Taking certain forms of hormonal birth control.  Smoking.  What are the signs or symptoms? Many ovarian cysts do not cause symptoms. If symptoms are present, they may include:  Pelvic pain or pressure.  Pain in the lower abdomen.  Pain during sex.  Abdominal swelling.  Abnormal menstrual periods.  Increasing pain with menstrual periods.  How is this diagnosed? These cysts are commonly found during a routine pelvic exam. You may have tests to find out more about the cyst, such as:  Ultrasound.  X-ray of the pelvis.  CT scan.  MRI.  Blood tests.  How is this treated? Many ovarian cysts go away on their own without treatment. Your health care provider may want to check your cyst regularly for 2-3 months to see if it changes. If you are in menopause, it is especially important to have your cyst monitored closely because menopausal women have a higher rate of ovarian cancer. When treatment is needed, it may include:  Medicines to help relieve pain.  A procedure to drain the cyst (aspiration).  Surgery to remove the whole cyst.  Hormone treatment or birth control pills. These methods are sometimes used to help dissolve a cyst.  Follow these instructions at home:  Take over-the-counter and prescription medicines only as told by your health care provider.  Do not drive or use heavy machinery while taking prescription pain medicine.  Get regular pelvic exams and Pap tests as often as told by your health care   provider.  Return to your normal activities as told by your health care provider. Ask your health care provider what activities are safe for you.  Do not use any products that contain nicotine or tobacco, such as cigarettes and e-cigarettes. If you need help quitting, ask your health care provider.  Keep all follow-up visits as told by your health care provider.  This is important. Contact a health care provider if:  Your periods are late, irregular, or painful, or they stop.  You have pelvic pain that does not go away.  You have pressure on your bladder or trouble emptying your bladder completely.  You have pain during sex.  You have any of the following in your abdomen: ? A feeling of fullness. ? Pressure. ? Discomfort. ? Pain that does not go away. ? Swelling.  You feel generally ill.  You become constipated.  You lose your appetite.  You develop severe acne.  You start to have more body hair and facial hair.  You are gaining weight or losing weight without changing your exercise and eating habits.  You think you may be pregnant. Get help right away if:  You have abdominal pain that is severe or gets worse.  You cannot eat or drink without vomiting.  You suddenly develop a fever.  Your menstrual period is much heavier than usual. This information is not intended to replace advice given to you by your health care provider. Make sure you discuss any questions you have with your health care provider. Document Released: 09/03/2005 Document Revised: 03/23/2016 Document Reviewed: 02/05/2016 Elsevier Interactive Patient Education  2018 Elsevier Inc.  

## 2018-07-02 ENCOUNTER — Other Ambulatory Visit: Payer: Self-pay | Admitting: Obstetrics and Gynecology

## 2018-07-02 DIAGNOSIS — N83209 Unspecified ovarian cyst, unspecified side: Secondary | ICD-10-CM

## 2018-07-16 ENCOUNTER — Ambulatory Visit (INDEPENDENT_AMBULATORY_CARE_PROVIDER_SITE_OTHER): Payer: BLUE CROSS/BLUE SHIELD

## 2018-07-16 DIAGNOSIS — N854 Malposition of uterus: Secondary | ICD-10-CM | POA: Diagnosis not present

## 2018-07-16 DIAGNOSIS — N83209 Unspecified ovarian cyst, unspecified side: Secondary | ICD-10-CM

## 2018-08-01 ENCOUNTER — Ambulatory Visit (INDEPENDENT_AMBULATORY_CARE_PROVIDER_SITE_OTHER): Payer: BLUE CROSS/BLUE SHIELD | Admitting: Obstetrics and Gynecology

## 2018-08-01 ENCOUNTER — Encounter: Payer: Self-pay | Admitting: Obstetrics and Gynecology

## 2018-08-01 VITALS — BP 118/67 | HR 58 | Ht 67.0 in | Wt 148.7 lb

## 2018-08-01 DIAGNOSIS — Z01419 Encounter for gynecological examination (general) (routine) without abnormal findings: Secondary | ICD-10-CM

## 2018-08-01 DIAGNOSIS — Z23 Encounter for immunization: Secondary | ICD-10-CM

## 2018-08-01 MED ORDER — ESCITALOPRAM OXALATE 20 MG PO TABS: 20.0000 mg | ORAL_TABLET | Freq: Every day | ORAL | 4 refills | Status: DC

## 2018-08-01 MED ORDER — DESOGESTREL-ETHINYL ESTRADIOL 0.15-0.02/0.01 MG (21/5) PO TABS: 1.0000 | ORAL_TABLET | Freq: Every day | ORAL | 11 refills | Status: DC

## 2018-08-01 NOTE — Progress Notes (Signed)
  Subjective:     Caroline Gonzalez is a single white 22 y.o. female and is here for a comprehensive physical exam.G1P1, sexually active with live in boyfriend.  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  Social History   Socioeconomic History  . Marital status: Single    Spouse name: Not on file  . Number of children: Not on file  . Years of education: Not on file  . Highest education level: Not on file  Occupational History  . Not on file  Social Needs  . Financial resource strain: Not on file  . Food insecurity:    Worry: Not on file    Inability: Not on file  . Transportation needs:    Medical: Not on file    Non-medical: Not on file  Tobacco Use  . Smoking status: Never Smoker  . Smokeless tobacco: Never Used  Substance and Sexual Activity  . Alcohol use: Yes    Alcohol/week: 0.0 standard drinks    Comment: social  . Drug use: No    Comment: THC--recently quit  . Sexual activity: Yes  Lifestyle  . Physical activity:    Days per week: Not on file    Minutes per session: Not on file  . Stress: Not on file  Relationships  . Social connections:    Talks on phone: Not on file    Gets together: Not on file    Attends religious service: Not on file    Active member of club or organization: Not on file    Attends meetings of clubs or organizations: Not on file    Relationship status: Not on file  . Intimate partner violence:    Fear of current or ex partner: Not on file    Emotionally abused: Not on file    Physically abused: Not on file    Forced sexual activity: Not on file  Other Topics Concern  . Not on file  Social History Narrative  . Not on file   Health Maintenance  Topic Date Due  . CHLAMYDIA SCREENING  07/28/2010  . HIV Screening  07/28/2010  . TETANUS/TDAP  04/10/2017  . PAP SMEAR  07/19/2020  . INFLUENZA VACCINE  Completed    The following portions of the patient's history were reviewed and updated as appropriate:  allergies, current medications, past family history, past medical history, past social history, past surgical history and problem list.  Review of Systems Pertinent items are noted in HPI.   Objective:    General appearance: alert, cooperative and appears stated age Neck: no adenopathy, no carotid bruit, no JVD, supple, symmetrical, trachea midline and thyroid not enlarged, symmetric, no tenderness/mass/nodules Lungs: clear to auscultation bilaterally Breasts: normal appearance, no masses or tenderness Heart: regular rate and rhythm, S1, S2 normal, no murmur, click, rub or gallop Abdomen: soft, non-tender; bowel sounds normal; no masses,  no organomegaly Pelvic: cervix normal in appearance, external genitalia normal, no adnexal masses or tenderness, no cervical motion tenderness, rectovaginal septum normal, uterus normal size, shape, and consistency and vagina normal without discharge    Assessment:    Healthy female exam. Needs flu vaccine     Plan:  Flu vaccine given meds refilled RTC 1 year or as needed    ,CNM   See After Visit Summary for Counseling Recommendations

## 2018-08-01 NOTE — Patient Instructions (Signed)
Preventive Care 18-39 Years, Female Preventive care refers to lifestyle choices and visits with your health care provider that can promote health and wellness. What does preventive care include?  A yearly physical exam. This is also called an annual well check.  Dental exams once or twice a year.  Routine eye exams. Ask your health care provider how often you should have your eyes checked.  Personal lifestyle choices, including: ? Daily care of your teeth and gums. ? Regular physical activity. ? Eating a healthy diet. ? Avoiding tobacco and drug use. ? Limiting alcohol use. ? Practicing safe sex. ? Taking vitamin and mineral supplements as recommended by your health care provider. What happens during an annual well check? The services and screenings done by your health care provider during your annual well check will depend on your age, overall health, lifestyle risk factors, and family history of disease. Counseling Your health care provider may ask you questions about your:  Alcohol use.  Tobacco use.  Drug use.  Emotional well-being.  Home and relationship well-being.  Sexual activity.  Eating habits.  Work and work Statistician.  Method of birth control.  Menstrual cycle.  Pregnancy history.  Screening You may have the following tests or measurements:  Height, weight, and BMI.  Diabetes screening. This is done by checking your blood sugar (glucose) after you have not eaten for a while (fasting).  Blood pressure.  Lipid and cholesterol levels. These may be checked every 5 years starting at age 38.  Skin check.  Hepatitis C blood test.  Hepatitis B blood test.  Sexually transmitted disease (STD) testing.  BRCA-related cancer screening. This may be done if you have a family history of breast, ovarian, tubal, or peritoneal cancers.  Pelvic exam and Pap test. This may be done every 3 years starting at age 38. Starting at age 30, this may be done  every 5 years if you have a Pap test in combination with an HPV test.  Discuss your test results, treatment options, and if necessary, the need for more tests with your health care provider. Vaccines Your health care provider may recommend certain vaccines, such as:  Influenza vaccine. This is recommended every year.  Tetanus, diphtheria, and acellular pertussis (Tdap, Td) vaccine. You may need a Td booster every 10 years.  Varicella vaccine. You may need this if you have not been vaccinated.  HPV vaccine. If you are 39 or younger, you may need three doses over 6 months.  Measles, mumps, and rubella (MMR) vaccine. You may need at least one dose of MMR. You may also need a second dose.  Pneumococcal 13-valent conjugate (PCV13) vaccine. You may need this if you have certain conditions and were not previously vaccinated.  Pneumococcal polysaccharide (PPSV23) vaccine. You may need one or two doses if you smoke cigarettes or if you have certain conditions.  Meningococcal vaccine. One dose is recommended if you are age 68-21 years and a first-year college student living in a residence hall, or if you have one of several medical conditions. You may also need additional booster doses.  Hepatitis A vaccine. You may need this if you have certain conditions or if you travel or work in places where you may be exposed to hepatitis A.  Hepatitis B vaccine. You may need this if you have certain conditions or if you travel or work in places where you may be exposed to hepatitis B.  Haemophilus influenzae type b (Hib) vaccine. You may need this  if you have certain risk factors.  Talk to your health care provider about which screenings and vaccines you need and how often you need them. This information is not intended to replace advice given to you by your health care provider. Make sure you discuss any questions you have with your health care provider. Document Released: 10/30/2001 Document Revised:  05/23/2016 Document Reviewed: 07/05/2015 Elsevier Interactive Patient Education  2018 Elsevier Inc.  

## 2018-09-29 ENCOUNTER — Ambulatory Visit: Payer: BLUE CROSS/BLUE SHIELD | Admitting: Internal Medicine

## 2018-09-29 ENCOUNTER — Encounter: Payer: Self-pay | Admitting: Internal Medicine

## 2018-09-29 VITALS — BP 112/70 | HR 70 | Temp 98.1°F | Wt 156.0 lb

## 2018-09-29 DIAGNOSIS — R0981 Nasal congestion: Secondary | ICD-10-CM | POA: Diagnosis not present

## 2018-09-29 DIAGNOSIS — J069 Acute upper respiratory infection, unspecified: Secondary | ICD-10-CM

## 2018-09-29 DIAGNOSIS — R059 Cough, unspecified: Secondary | ICD-10-CM

## 2018-09-29 DIAGNOSIS — R05 Cough: Secondary | ICD-10-CM

## 2018-09-29 DIAGNOSIS — J029 Acute pharyngitis, unspecified: Secondary | ICD-10-CM

## 2018-09-29 DIAGNOSIS — B9789 Other viral agents as the cause of diseases classified elsewhere: Secondary | ICD-10-CM

## 2018-09-29 LAB — POCT RAPID STREP A (OFFICE): Rapid Strep A Screen: NEGATIVE

## 2018-09-29 MED ORDER — METHYLPREDNISOLONE ACETATE 80 MG/ML IJ SUSP
80.0000 mg | Freq: Once | INTRAMUSCULAR | Status: AC
  Administered 2018-09-29: 80 mg via INTRAMUSCULAR

## 2018-09-29 NOTE — Addendum Note (Signed)
Addended by: Roena Malady on: 09/29/2018 04:50 PM   Modules accepted: Orders

## 2018-09-29 NOTE — Patient Instructions (Signed)

## 2018-09-29 NOTE — Progress Notes (Signed)
HPI  Pt presents to the clinic today with c/o nasal congestion, sore throat and cough. She reports this started 3 days ago. She is blowing green mucous out of her nose. She denies difficulty swallowing but has had some post nasal drip. The cough is non productive. She denies runny nose, ear pain or shortness of breath. She denies fever, chills or body aches. She has tried Tylenol Sinus with some relief. She has had sick contacts.  Review of Systems      Past Medical History:  Diagnosis Date  . Anxiety   . Contraception   . Depression   . Sleep disturbance     Family History  Problem Relation Age of Onset  . Diabetes Paternal Grandfather   . Breast cancer Paternal Grandmother   . Breast cancer Paternal Aunt   . Hypertension Mother   . Hearing loss Maternal Aunt   . Hearing loss Maternal Uncle   . Heart disease Maternal Grandmother   . Heart disease Maternal Grandfather     Social History   Socioeconomic History  . Marital status: Single    Spouse name: Not on file  . Number of children: Not on file  . Years of education: Not on file  . Highest education level: Not on file  Occupational History  . Not on file  Social Needs  . Financial resource strain: Not on file  . Food insecurity:    Worry: Not on file    Inability: Not on file  . Transportation needs:    Medical: Not on file    Non-medical: Not on file  Tobacco Use  . Smoking status: Never Smoker  . Smokeless tobacco: Never Used  Substance and Sexual Activity  . Alcohol use: Yes    Alcohol/week: 0.0 standard drinks    Comment: social  . Drug use: No    Comment: THC--recently quit  . Sexual activity: Yes  Lifestyle  . Physical activity:    Days per week: Not on file    Minutes per session: Not on file  . Stress: Not on file  Relationships  . Social connections:    Talks on phone: Not on file    Gets together: Not on file    Attends religious service: Not on file    Active member of club or  organization: Not on file    Attends meetings of clubs or organizations: Not on file    Relationship status: Not on file  . Intimate partner violence:    Fear of current or ex partner: Not on file    Emotionally abused: Not on file    Physically abused: Not on file    Forced sexual activity: Not on file  Other Topics Concern  . Not on file  Social History Narrative  . Not on file    Allergies  Allergen Reactions  . Codeine     REACTION: hallucinations  . Morphine     REACTION: hallucinations  . Sulfa Antibiotics   . Tamiflu [Oseltamivir Phosphate]      Constitutional:  Denies headache, fatigue, fever or abrupt weight changes.  HEENT:  Positive nasal congestion, sore throat. Denies eye redness, eye pain, pressure behind the eyes, facial pain, nasal congestion, ear pain, ringing in the ears, wax buildup, runny nose or bloody nose. Respiratory: Positive cough. Denies difficulty breathing or shortness of breath.  Cardiovascular: Denies chest pain, chest tightness, palpitations or swelling in the hands or feet.   No other specific complaints in a  complete review of systems (except as listed in HPI above).  Objective:   BP 112/70   Pulse 70   Temp 98.1 F (36.7 C) (Oral)   Wt 156 lb (70.8 kg)   SpO2 98%   BMI 24.43 kg/m  Wt Readings from Last 3 Encounters:  09/29/18 156 lb (70.8 kg)  08/01/18 148 lb 11.2 oz (67.4 kg)  06/04/18 146 lb 11.2 oz (66.5 kg)     General: Appears her stated age, in NAD. HEENT: Head: normal shape and size, no sinus tenderness noted; Ears: Tm's gray and intact, normal light reflex; Nose: mucosa pink and moist, septum midline; Throat/Mouth: + PND. Teeth present, mucosa erythematous and moist, no exudate noted, no lesions or ulcerations noted.  Neck: No cervical lymphadenopathy.  Cardiovascular: Normal rate and rhythm. S1,S2 noted.  No murmur, rubs or gallops noted.  Pulmonary/Chest: Normal effort and positive vesicular breath sounds. No  respiratory distress. No wheezes, rales or ronchi noted.       Assessment & Plan:   Nasal Congestion, Sore Throat, Cough:  RST: negative Get some rest and drink plenty of water Do salt water gargles for the sore throat 80 mg Depo IM today Start Zyrtec and Flonase OTC Delsym or Nyquil as needed for cough  RTC as needed or if symptoms persist.   Nicki Reaper, NP

## 2018-10-09 DIAGNOSIS — M5442 Lumbago with sciatica, left side: Secondary | ICD-10-CM | POA: Diagnosis not present

## 2018-10-09 DIAGNOSIS — M9903 Segmental and somatic dysfunction of lumbar region: Secondary | ICD-10-CM | POA: Diagnosis not present

## 2018-10-09 DIAGNOSIS — M9904 Segmental and somatic dysfunction of sacral region: Secondary | ICD-10-CM | POA: Diagnosis not present

## 2018-10-09 DIAGNOSIS — M5441 Lumbago with sciatica, right side: Secondary | ICD-10-CM | POA: Diagnosis not present

## 2018-10-10 ENCOUNTER — Ambulatory Visit: Payer: BLUE CROSS/BLUE SHIELD | Admitting: Internal Medicine

## 2018-10-10 ENCOUNTER — Encounter: Payer: Self-pay | Admitting: Internal Medicine

## 2018-10-10 VITALS — BP 112/66 | HR 71 | Temp 98.7°F | Wt 151.0 lb

## 2018-10-10 DIAGNOSIS — B084 Enteroviral vesicular stomatitis with exanthem: Secondary | ICD-10-CM

## 2018-10-10 DIAGNOSIS — M5441 Lumbago with sciatica, right side: Secondary | ICD-10-CM

## 2018-10-10 DIAGNOSIS — J029 Acute pharyngitis, unspecified: Secondary | ICD-10-CM

## 2018-10-10 DIAGNOSIS — M9903 Segmental and somatic dysfunction of lumbar region: Secondary | ICD-10-CM | POA: Diagnosis not present

## 2018-10-10 DIAGNOSIS — M9904 Segmental and somatic dysfunction of sacral region: Secondary | ICD-10-CM | POA: Diagnosis not present

## 2018-10-10 DIAGNOSIS — M5442 Lumbago with sciatica, left side: Secondary | ICD-10-CM | POA: Diagnosis not present

## 2018-10-10 MED ORDER — PREDNISONE 10 MG PO TABS: ORAL_TABLET | ORAL | 0 refills | Status: DC

## 2018-10-10 MED ORDER — MAGIC MOUTHWASH W/LIDOCAINE: 5.0000 mL | Freq: Four times a day (QID) | ORAL | 0 refills | Status: DC | PRN

## 2018-10-10 MED ORDER — ORPHENADRINE CITRATE ER 100 MG PO TB12: 100.0000 mg | ORAL_TABLET | Freq: Two times a day (BID) | ORAL | 0 refills | Status: DC

## 2018-10-10 NOTE — Progress Notes (Signed)
Subjective:    Patient ID: Caroline Gonzalez, female    DOB: Oct 25, 1995, 23 y.o.   MRN: [id number]  HPI  Pt presents to the clinic today with c/o right sided back pain. She reports this started 3 days ago, but has been an intermittent issue in the past. She describes the pain as searing. The pain radiates down her right leg. She denies numbness, tingling or weakness. She denies recent injury or anything that would have caused it to flare up. She has tried heat, ice and been to a chiropractor with minimal relief. She has not tried any medications OTC.  She also reports sore throat and fever. She reports this started last night. She is having some difficulty swallowing. She denies headache, runny nose, nasal congestion, ear pain or cough. She denies chills or body aches. She reports her sone was diagnosed with hand, foot and mouth disease last week.  Review of Systems      Past Medical History:  Diagnosis Date  . Anxiety   . Contraception   . Depression   . Sleep disturbance     Current Outpatient Medications  Medication Sig Dispense Refill  . desogestrel-ethinyl estradiol (MIRCETTE) 0.15-0.02/0.01 MG (21/5) tablet Take 1 tablet by mouth daily. 1 Package 11  . escitalopram (LEXAPRO) 20 MG tablet Take 1 tablet (20 mg total) by mouth daily. 90 tablet 4  . hydrOXYzine (ATARAX/VISTARIL) 10 MG tablet Take 1 tablet (10 mg total) by mouth daily as needed. 30 tablet 0   No current facility-administered medications for this visit.     Allergies  Allergen Reactions  . Codeine     REACTION: hallucinations  . Morphine     REACTION: hallucinations  . Sulfa Antibiotics   . Tamiflu [Oseltamivir Phosphate]     Family History  Problem Relation Age of Onset  . Diabetes Paternal Grandfather   . Breast cancer Paternal Grandmother   . Breast cancer Paternal Aunt   . Hypertension Mother   . Hearing loss Maternal Aunt   . Hearing loss Maternal Uncle   . Heart disease Maternal  Grandmother   . Heart disease Maternal Grandfather     Social History   Socioeconomic History  . Marital status: Single    Spouse name: Not on file  . Number of children: Not on file  . Years of education: Not on file  . Highest education level: Not on file  Occupational History  . Not on file  Social Needs  . Financial resource strain: Not on file  . Food insecurity:    Worry: Not on file    Inability: Not on file  . Transportation needs:    Medical: Not on file    Non-medical: Not on file  Tobacco Use  . Smoking status: Never Smoker  . Smokeless tobacco: Never Used  Substance and Sexual Activity  . Alcohol use: Yes    Alcohol/week: 0.0 standard drinks    Comment: social  . Drug use: No    Comment: THC--recently quit  . Sexual activity: Yes  Lifestyle  . Physical activity:    Days per week: Not on file    Minutes per session: Not on file  . Stress: Not on file  Relationships  . Social connections:    Talks on phone: Not on file    Gets together: Not on file    Attends religious service: Not on file    Active member of club or organization: Not on file  Attends meetings of clubs or organizations: Not on file    Relationship status: Not on file  . Intimate partner violence:    Fear of current or ex partner: Not on file    Emotionally abused: Not on file    Physically abused: Not on file    Forced sexual activity: Not on file  Other Topics Concern  . Not on file  Social History Narrative  . Not on file     Constitutional: Pt reports fever. Denies malaise, fatigue, headache or abrupt weight changes.  HEENT: Pt reports sore throat. Denies eye pain, eye redness, ear pain, ringing in the ears, wax buildup, runny nose, nasal congestion, bloody nose Respiratory: Denies difficulty breathing, shortness of breath, cough or sputum production.   Cardiovascular: Denies chest pain, chest tightness, palpitations or swelling in the hands or feet.  Musculoskeletal: Pt  reports back pain. Denies decrease in range of motion, difficulty with gait, or joint swelling.   Neurological: Denies numbness, tingling, weakness or problems with balance and coordination.    No other specific complaints in a complete review of systems (except as listed in HPI above).  Objective:   Physical Exam  BP 112/66   Pulse 71   Temp 98.7 F (37.1 C) (Oral)   Wt 151 lb (68.5 kg)   SpO2 99%   BMI 23.65 kg/m  Wt Readings from Last 3 Encounters:  10/10/18 151 lb (68.5 kg)  09/29/18 156 lb (70.8 kg)  08/01/18 148 lb 11.2 oz (67.4 kg)    General: Appears her stated age, well developed, well nourished in NAD. Skin: Warm, dry and intact. No rashes noted. HEENT: Head: normal shape and size; Throat/Mouth: Teeth present, mucosa erythematous and moist, no exudate, lesions noted. Multiple oral ulcerations noted on buccal mucosa and uvula. Neck:  No adenopathy noted. Cardiovascular: Normal rate and rhythm. S1,S2 noted.  No murmur, rubs or gallops noted.  Pulmonary/Chest: Normal effort and positive vesicular breath sounds. No respiratory distress. No wheezes, rales or ronchi noted.  Musculoskeletal: Normal flexion, extension and rotation of the spine. Bony tenderness noted over the lumbar spine and right paralumbar muscles. Strength 5/5 BUE/BLE. No difficulty with gait.  Neurological: Alert and oriented. Negative SLR on the right. Sensation intact to BLE.  BMET    Component Value Date/Time   NA 144 07/19/2017 1704   NA 140 11/30/2012 2156   K 4.2 07/19/2017 1704   K 3.5 11/30/2012 2156   CL 104 07/19/2017 1704   CL 106 11/30/2012 2156   CO2 26 07/19/2017 1704   CO2 28 (H) 11/30/2012 2156   GLUCOSE 87 07/19/2017 1704   GLUCOSE 85 11/30/2012 2156   BUN 13 07/19/2017 1704   BUN 7 (L) 11/30/2012 2156   CREATININE 0.83 07/19/2017 1704   CREATININE 0.61 11/30/2012 2156   CALCIUM 9.7 07/19/2017 1704   CALCIUM 9.1 11/30/2012 2156   GFRNONAA 101 07/19/2017 1704   GFRAA 117  07/19/2017 1704    Lipid Panel  No results found for: CHOL, TRIG, HDL, CHOLHDL, VLDL, LDLCALC  CBC    Component Value Date/Time   WBC 6.5 07/19/2017 1704   WBC 13.9 (H) 09/06/2014 0938   RBC 4.67 07/19/2017 1704   RBC 4.79 09/06/2014 0938   HGB 13.4 07/19/2017 1704   HCT 40.3 07/19/2017 1704   PLT 326 07/19/2017 1704   MCV 86 07/19/2017 1704   MCV 88 09/06/2014 0938   MCH 28.7 07/19/2017 1704   MCH 28.8 09/06/2014 0938   MCHC  33.3 07/19/2017 1704   MCHC 32.6 09/06/2014 0938   RDW 13.1 07/19/2017 1704   RDW 13.1 09/06/2014 0938   LYMPHSABS 2.3 09/06/2014 0938   MONOABS 1.5 (H) 09/06/2014 0938   EOSABS 0.1 09/06/2014 0938   BASOSABS 0.1 09/06/2014 0938    Hgb A1C No results found for: HGBA1C          Assessment & Plan:   Sore Throat, HFM Disease:  Discussed viral nature, mode of transmission RX for Magic Mouthwash with Lidocaine  Acute Right Sided Low Back Pain with Right Sided Sciatica:  RX for Pred Taper x 6 days RX for Norflex 100 mg BID- sedation caution given Use ice instead of heat Stretching exercises given  Return precautions discussed Caroline Reaperegina Jaydence Arnesen, NP

## 2018-10-10 NOTE — Patient Instructions (Signed)
Sciatica    Sciatica is pain, numbness, weakness, or tingling along your sciatic nerve. The sciatic nerve starts in the lower back and goes down the back of each leg. Sciatica happens when this nerve is pinched or has pressure put on it. Sciatica usually goes away on its own or with treatment. Sometimes, sciatica may keep coming back (recur).  Follow these instructions at home:  Medicines  · Take over-the-counter and prescription medicines only as told by your doctor.  · Do not drive or use heavy machinery while taking prescription pain medicine.  Managing pain  · If directed, put ice on the affected area.  ? Put ice in a plastic bag.  ? Place a towel between your skin and the bag.  ? Leave the ice on for 20 minutes, 2-3 times a day.  · After icing, apply heat to the affected area before you exercise or as often as told by your doctor. Use the heat source that your doctor tells you to use, such as a moist heat pack or a heating pad.  ? Place a towel between your skin and the heat source.  ? Leave the heat on for 20-30 minutes.  ? Remove the heat if your skin turns bright red. This is especially important if you are unable to feel pain, heat, or cold. You may have a greater risk of getting burned.  Activity  · Return to your normal activities as told by your doctor. Ask your doctor what activities are safe for you.  ? Avoid activities that make your sciatica worse.  · Take short rests during the day. Rest in a lying or standing position. This is usually better than sitting to rest.  ? When you rest for a long time, do some physical activity or stretching between periods of rest.  ? Avoid sitting for a long time without moving. Get up and move around at least one time each hour.  · Exercise and stretch regularly, as told by your doctor.  · Do not lift anything that is heavier than 10 lb (4.5 kg) while you have symptoms of sciatica.  ? Avoid lifting heavy things even when you do not have symptoms.  ? Avoid lifting  heavy things over and over.  · When you lift objects, always lift in a way that is safe for your body. To do this, you should:  ? Bend your knees.  ? Keep the object close to your body.  ? Avoid twisting.  General instructions  · Use good posture.  ? Avoid leaning forward when you are sitting.  ? Avoid hunching over when you are standing.  · Stay at a healthy weight.  · Wear comfortable shoes that support your feet. Avoid wearing high heels.  · Avoid sleeping on a mattress that is too soft or too hard. You might have less pain if you sleep on a mattress that is firm enough to support your back.  · Keep all follow-up visits as told by your doctor. This is important.  Contact a doctor if:  · You have pain that:  ? Wakes you up when you are sleeping.  ? Gets worse when you lie down.  ? Is worse than the pain you have had in the past.  ? Lasts longer than 4 weeks.  · You lose weight for without trying.  Get help right away if:  · You cannot control when you pee (urinate) or poop (have a bowel movement).  · You   have weakness in any of these areas and it gets worse.  ? Lower back.  ? Lower belly (pelvis).  ? Butt (buttocks).  ? Legs.  · You have redness or swelling of your back.  · You have a burning feeling when you pee.  This information is not intended to replace advice given to you by your health care provider. Make sure you discuss any questions you have with your health care provider.  Document Released: 06/12/2008 Document Revised: 02/09/2016 Document Reviewed: 05/13/2015  Elsevier Interactive Patient Education © 2019 [employer]

## 2018-10-28 ENCOUNTER — Encounter: Payer: Self-pay | Admitting: Medical

## 2018-10-28 ENCOUNTER — Ambulatory Visit: Payer: Self-pay | Admitting: Medical

## 2018-10-28 VITALS — BP 141/85 | HR 83 | Temp 98.5°F | Resp 16 | Wt 154.2 lb

## 2018-10-28 DIAGNOSIS — R062 Wheezing: Secondary | ICD-10-CM

## 2018-10-28 DIAGNOSIS — R0602 Shortness of breath: Secondary | ICD-10-CM

## 2018-10-28 DIAGNOSIS — J0100 Acute maxillary sinusitis, unspecified: Secondary | ICD-10-CM

## 2018-10-28 DIAGNOSIS — R059 Cough, unspecified: Secondary | ICD-10-CM

## 2018-10-28 DIAGNOSIS — R05 Cough: Secondary | ICD-10-CM

## 2018-10-28 MED ORDER — BENZONATATE 100 MG PO CAPS: 100.0000 mg | ORAL_CAPSULE | Freq: Three times a day (TID) | ORAL | 0 refills | Status: DC | PRN

## 2018-10-28 MED ORDER — ALBUTEROL SULFATE HFA 108 (90 BASE) MCG/ACT IN AERS: 2.0000 | INHALATION_SPRAY | Freq: Four times a day (QID) | RESPIRATORY_TRACT | 0 refills | Status: DC | PRN

## 2018-10-28 MED ORDER — PREDNISONE 10 MG (21) PO TBPK: ORAL_TABLET | ORAL | 0 refills | Status: DC

## 2018-10-28 MED ORDER — AMOXICILLIN-POT CLAVULANATE 875-125 MG PO TABS: 1.0000 | ORAL_TABLET | Freq: Two times a day (BID) | ORAL | 0 refills | Status: DC

## 2018-10-28 NOTE — Progress Notes (Signed)
Subjective:    Patient ID: Caroline Gonzalez, female    DOB: [birth year], 23 y.o.   MRN: [id number]  HPI  23 yo female in non acute distress. Symptoms started  3 weeks ago nasal congestion , cough and shortness of breath , going up stairs and with excertion. Not short of breath just sitting on exam table . Fever 99.9. Cough productive green.Now with sore thoat. Her mother recommended she see someone.  Hx of  Bronchitis one year ago, no history of Pneumonia. Taking Sudafed PE with not much relief.   Blood pressure (!) 141/85, pulse 83, temperature 98.5 F (36.9 C), temperature source Tympanic, resp. rate 16, weight 154 lb 3.2 oz (69.9 kg), last menstrual period 10/28/2018, SpO2 98 %. Allergies  Allergen Reactions  . Codeine     REACTION: hallucinations  . Morphine     REACTION: hallucinations  . Shrimp [Shellfish Allergy] Other (See Comments)    Vomiting, fever  . Sulfa Antibiotics   . Tamiflu [Oseltamivir Phosphate]      Review of Systems  Constitutional: Positive for appetite change (decreased), chills and fever.  HENT: Positive for congestion, postnasal drip, rhinorrhea, sinus pressure (maxillary), sinus pain, sore throat and voice change (horse). Negative for ear pain and sneezing.   Eyes: Negative for discharge and itching.  Respiratory: Positive for cough, chest tightness, shortness of breath (with excertion) and wheezing (little bit last night).   Cardiovascular: Negative for chest pain.  Gastrointestinal: Negative for abdominal pain, diarrhea, rectal pain and vomiting.  Endocrine: Negative for polydipsia, polyphagia and polyuria.  Genitourinary: Negative for dysuria.  Musculoskeletal: Positive for myalgias (occasionally).  Skin: Positive for rash (eczema on the back of the thighs).  Allergic/Immunologic: Positive for environmental allergies and food allergies (shrimp).  Neurological: Negative for dizziness, syncope, light-headedness and headaches.   Psychiatric/Behavioral: Negative for behavioral problems and suicidal ideas. The patient is not nervous/anxious.        Objective:   Physical Exam Vitals signs and nursing note reviewed.  Constitutional:      Appearance: Normal appearance. She is normal weight.  HENT:     Head: Normocephalic and atraumatic.     Jaw: There is normal jaw occlusion.     Right Ear: Hearing, ear canal and external ear normal. A middle ear effusion is present.     Left Ear: Hearing, ear canal and external ear normal. A middle ear effusion is present.     Nose: Congestion present.     Left Turbinates: Enlarged and swollen. Not pale.     Mouth/Throat:     Lips: Pink.     Mouth: Mucous membranes are moist.     Pharynx: Oropharynx is clear. Uvula midline. Posterior oropharyngeal erythema (mild) present. No pharyngeal swelling, oropharyngeal exudate or uvula swelling.     Tonsils: Swelling: 2+ on the right. 2+ on the left.  Eyes:     Extraocular Movements: Extraocular movements intact.     Conjunctiva/sclera: Conjunctivae normal.     Pupils: Pupils are equal, round, and reactive to light.  Neck:     Musculoskeletal: Normal range of motion.  Cardiovascular:     Rate and Rhythm: Normal rate and regular rhythm.     Heart sounds: Normal heart sounds.  Pulmonary:     Effort: Pulmonary effort is normal. No respiratory distress.     Breath sounds: No stridor. Rhonchi (lower left with inspiration) present. No wheezing or rales.  Musculoskeletal: Normal range of motion.  Lymphadenopathy:  Cervical: No cervical adenopathy.  Skin:    General: Skin is warm and dry.     Capillary Refill: Capillary refill takes less than 2 seconds.  Neurological:     General: No focal deficit present.     Mental Status: She is alert and oriented to person, place, and time.  Psychiatric:        Mood and Affect: Mood normal.        Behavior: Behavior normal.        Thought Content: Thought content normal.        Judgment:  Judgment normal.    Patient not short of breath on exam, easily gets off the exam table and puts on her coat, walking to door no shortness of breath noted       Assessment & Plan:  Sinusitis, Upper Respiratory Infection Eustachian ;tube dysfuncion Cough and wheezing. Meds ordered this encounter  Medications  . amoxicillin-clavulanate (AUGMENTIN) 875-125 MG tablet    Sig: Take 1 tablet by mouth 2 (two) times daily.    Dispense:  20 tablet    Refill:  0  . albuterol (PROVENTIL HFA;VENTOLIN HFA) 108 (90 Base) MCG/ACT inhaler    Sig: Inhale 2 puffs into the lungs every 6 (six) hours as needed for wheezing or shortness of breath.    Dispense:  1 Inhaler    Refill:  0  . benzonatate (TESSALON PERLES) 100 MG capsule    Sig: Take 1 capsule (100 mg total) by mouth 3 (three) times daily as needed.    Dispense:  30 capsule    Refill:  0  . predniSONE (STERAPRED UNI-PAK 21 TAB) 10 MG (21) TBPK tablet    Sig: Take 6 tablets by mouth today then 5 tablets tomorrow then one less each day there after. Take with food.    Dispense:  21 tablet    Refill:  0  Try OTC Zyrtec and Flonase take per package directions. Return to clinic in  3-5  days if not improving or worsening. Rest , increase fluids , gatorade., bland food. OTC Motrin or Tylenol for pain or fever. Patient verbalizes understanding and has no questions at discharge.Work note given for 2 days.

## 2018-10-28 NOTE — Patient Instructions (Signed)
Upper Respiratory Infection, Adult An upper respiratory infection (URI) affects the nose, throat, and upper air passages. URIs are caused by germs (viruses). The most common type of URI is often called "the common cold." Medicines cannot cure URIs, but you can do things at home to relieve your symptoms. URIs usually get better within 7-10 days. Follow these instructions at home: Activity  Rest as needed.  If you have a fever, stay home from work or school until your fever is gone, or until your doctor says you may return to work or school. ? You should stay home until you cannot spread the infection anymore (you are not contagious). ? Your doctor may have you wear a face mask so you have less risk of spreading the infection. Relieving symptoms  Gargle with a salt-water mixture 3-4 times a day or as needed. To make a salt-water mixture, completely dissolve -1 tsp of salt in 1 cup of warm water.  Use a cool-mist humidifier to add moisture to the air. This can help you breathe more easily. Eating and drinking   Drink enough fluid to keep your pee (urine) pale yellow.  Eat soups and other clear broths. General instructions   Take over-the-counter and prescription medicines only as told by your doctor. These include cold medicines, fever reducers, and cough suppressants.  Do not use any products that contain nicotine or tobacco. These include cigarettes and e-cigarettes. If you need help quitting, ask your doctor.  Avoid being where people are smoking (avoid secondhand smoke).  Make sure you get regular shots and get the flu shot every year.  Keep all follow-up visits as told by your doctor. This is important. How to avoid spreading infection to others   Wash your hands often with soap and water. If you do not have soap and water, use hand sanitizer.  Avoid touching your mouth, face, eyes, or nose.  Cough or sneeze into a tissue or your sleeve or elbow. Do not cough or sneeze  into your hand or into the air. Contact a doctor if:  You are getting worse, not better.  You have any of these: ? A fever. ? Chills. ? Brown or red mucus in your nose. ? Yellow or brown fluid (discharge)coming from your nose. ? Pain in your face, especially when you bend forward. ? Swollen neck glands. ? Pain with swallowing. ? White areas in the back of your throat. Get help right away if:  You have shortness of breath that gets worse.  You have very bad or constant: ? Headache. ? Ear pain. ? Pain in your forehead, behind your eyes, and over your cheekbones (sinus pain). ? Chest pain.  You have long-lasting (chronic) lung disease along with any of these: ? Wheezing. ? Long-lasting cough. ? Coughing up blood. ? A change in your usual mucus.  You have a stiff neck.  You have changes in your: ? Vision. ? Hearing. ? Thinking. ? Mood. Summary  An upper respiratory infection (URI) is caused by a germ called a virus. The most common type of URI is often called "the common cold."  URIs usually get better within 7-10 days.  Take over-the-counter and prescription medicines only as told by your doctor. This information is not intended to replace advice given to you by your health care provider. Make sure you discuss any questions you have with your health care provider. Document Released: 02/20/2008 Document Revised: 04/26/2017 Document Reviewed: 04/26/2017 Elsevier Interactive Patient Education  2019 [employer] Inc[employer]   Sinusitis, Adult Sinusitis is soreness and swelling (inflammation) of your sinuses. Sinuses are hollow spaces in the bones around your face. They are located:  Around your eyes.  In the middle of your forehead.  Behind your nose.  In your cheekbones. Your sinuses and nasal passages are lined with a fluid called mucus. Mucus drains out of your sinuses. Swelling can trap mucus in your sinuses. This lets germs (bacteria, virus, or fungus) grow, which leads to  infection. Most of the time, this condition is caused by a virus. What are the causes? This condition is caused by:  Allergies.  Asthma.  Germs.  Things that block your nose or sinuses.  Growths in the nose (nasal polyps).  Chemicals or irritants in the air.  Fungus (rare). What increases the risk? You are more likely to develop this condition if:  You have a weak body defense system (immune system).  You do a lot of swimming or diving.  You use nasal sprays too much.  You smoke. What are the signs or symptoms? The main symptoms of this condition are pain and a feeling of pressure around the sinuses. Other symptoms include:  Stuffy nose (congestion).  Runny nose (drainage).  Swelling and warmth in the sinuses.  Headache.  Toothache.  A cough that may get worse at night.  Mucus that collects in the throat or the back of the nose (postnasal drip).  Being unable to smell and taste.  Being very tired (fatigue).  A fever.  Sore throat.  Bad breath. How is this diagnosed? This condition is diagnosed based on:  Your symptoms.  Your medical history.  A physical exam.  Tests to find out if your condition is short-term (acute) or long-term (chronic). Your doctor may: ? Check your nose for growths (polyps). ? Check your sinuses using a tool that has a light (endoscope). ? Check for allergies or germs. ? Do imaging tests, such as an MRI or CT scan. How is this treated? Treatment for this condition depends on the cause and whether it is short-term or long-term.  If caused by a virus, your symptoms should go away on their own within 10 days. You may be given medicines to relieve symptoms. They include: ? Medicines that shrink swollen tissue in the nose. ? Medicines that treat allergies (antihistamines). ? A spray that treats swelling of the nostrils. ? Rinses that help get rid of thick mucus in your nose (nasal saline washes).  If caused by bacteria,  your doctor may wait to see if you will get better without treatment. You may be given antibiotic medicine if you have: ? A very bad infection. ? A weak body defense system.  If caused by growths in the nose, you may need to have surgery. Follow these instructions at home: Medicines  Take, use, or apply over-the-counter and prescription medicines only as told by your doctor. These may include nasal sprays.  If you were prescribed an antibiotic medicine, take it as told by your doctor. Do not stop taking the antibiotic even if you start to feel better. Hydrate and humidify   Drink enough water to keep your pee (urine) pale yellow.  Use a cool mist humidifier to keep the humidity level in your home above 50%.  Breathe in steam for 10-15 minutes, 3-4 times a day, or as told by your doctor. You can do this in the bathroom while a hot shower is running.  Try not to spend time in cool or dry   air. Rest  Rest as much as you can.  Sleep with your head raised (elevated).  Make sure you get enough sleep each night. General instructions   Put a warm, moist washcloth on your face 3-4 times a day, or as often as told by your doctor. This will help with discomfort.  Wash your hands often with soap and water. If there is no soap and water, use hand sanitizer.  Do not smoke. Avoid being around people who are smoking (secondhand smoke).  Keep all follow-up visits as told by your doctor. This is important. Contact a doctor if:  You have a fever.  Your symptoms get worse.  Your symptoms do not get better within 10 days. Get help right away if:  You have a very bad headache.  You cannot stop throwing up (vomiting).  You have very bad pain or swelling around your face or eyes.  You have trouble seeing.  You feel confused.  Your neck is stiff.  You have trouble breathing. Summary  Sinusitis is swelling of your sinuses. Sinuses are hollow spaces in the bones around your  face.  This condition is caused by tissues in your nose that become inflamed or swollen. This traps germs. These can lead to infection.  If you were prescribed an antibiotic medicine, take it as told by your doctor. Do not stop taking it even if you start to feel better.  Keep all follow-up visits as told by your doctor. This is important. This information is not intended to replace advice given to you by your health care provider. Make sure you discuss any questions you have with your health care provider. Document Released: 02/20/2008 Document Revised: 02/03/2018 Document Reviewed: 02/03/2018 Elsevier Interactive Patient Education  2019 [employer]  

## 2018-11-10 ENCOUNTER — Ambulatory Visit: Payer: Self-pay | Admitting: Medical

## 2018-11-10 ENCOUNTER — Encounter: Payer: Self-pay | Admitting: Medical

## 2018-11-10 VITALS — BP 141/82 | HR 88 | Temp 98.1°F | Resp 18 | Wt 155.2 lb

## 2018-11-10 DIAGNOSIS — J0101 Acute recurrent maxillary sinusitis: Secondary | ICD-10-CM

## 2018-11-10 MED ORDER — DOXYCYCLINE HYCLATE 100 MG PO TABS: 100.0000 mg | ORAL_TABLET | Freq: Two times a day (BID) | ORAL | 0 refills | Status: DC

## 2018-11-10 NOTE — Progress Notes (Signed)
Subjective:    Patient ID: Caroline Gonzalez, female    DOB: 08-14-1996, 23 y.o.   MRN: [id number]  HPI  23 yo female in non acute distress. Returns today due to not feeling any better. Seen on 10/28/2018 diagnosed with Maxillary Sinusitis,  URI, Eustachain tube dysfunction. Treated with Augmentin  875mg -125mg , Albuterol MDI , benzonatate capsules and  Prednisone taper.  Finished with Augmenting and Prednisone, returned to green dicharge out ot the nose. And cough productive green. after finishing the antibiotics.  Complains of shortness of breath. Going up and down to stairs. Used  Albuterol inhaler at 7:30am , with relief. Taking Sudafed per package instructions due to nasal congestion.  History of Pneumonia in middle school.  She is a nonsmoker Blood pressure (!) 141/82, pulse 88, temperature 98.1 F (36.7 C), temperature source Tympanic, resp. rate 18, weight 155 lb 3.2 oz (70.4 kg), last menstrual period 10/28/2018, SpO2 99 %.  Allergies  Allergen Reactions  . Codeine     REACTION: hallucinations  . Morphine     REACTION: hallucinations  . Shrimp [Shellfish Allergy] Other (See Comments)    Vomiting, fever  . Sulfa Antibiotics   . Tamiflu [Oseltamivir Phosphate]    Works at the [employer]. Father works in Fish farm manager, she is on his insurance.   Review of Systems  Constitutional: Negative for chills and fever.  HENT: Positive for congestion (consentrad on the left side behind eye and  on  maxillary and Frontal sinus areas), ear pain (left), postnasal drip, rhinorrhea, sinus pressure, sinus pain (left side), sneezing and sore throat (left side). Negative for voice change.   Eyes: Negative for discharge and itching.  Respiratory: Positive for cough, chest tightness (with couging or with going up stairs) and shortness of breath (all the time with exertion). Negative for wheezing.   Cardiovascular: Negative for chest pain.  Gastrointestinal: Negative for abdominal  pain, diarrhea, rectal pain and vomiting.  Genitourinary: Negative for dysuria.  Musculoskeletal: Negative for myalgias.  Skin: Negative for rash.  Allergic/Immunologic: Positive for environmental allergies and food allergies (shrimp).  Neurological: Positive for headaches (frontal and left side). Negative for dizziness, syncope and light-headedness.  Hematological: Negative for adenopathy.  Psychiatric/Behavioral: Negative for agitation, behavioral problems, confusion, self-injury, sleep disturbance and suicidal ideas. The patient is not nervous/anxious.    History every week or every other week  "I am not as miserable as I was last week, and I feel my shortness of breath has improved.".  Taking Flonase daily,  2 sprays each nostril.    Objective:   Physical Exam Vitals signs and nursing note reviewed.  Constitutional:      Appearance: Normal appearance.  HENT:     Head: Atraumatic.     Right Ear: Ear canal and external ear normal.     Left Ear: Ear canal and external ear normal.     Nose: Congestion present.     Mouth/Throat:     Mouth: Mucous membranes are moist.     Pharynx: Oropharynx is clear.  Eyes:     Extraocular Movements: Extraocular movements intact.     Conjunctiva/sclera: Conjunctivae normal.     Pupils: Pupils are equal, round, and reactive to light.  Neck:     Musculoskeletal: Normal range of motion and neck supple.  Cardiovascular:     Rate and Rhythm: Normal rate and regular rhythm.     Heart sounds: Normal heart sounds.  Pulmonary:     Effort: Pulmonary effort is normal.  No respiratory distress.     Breath sounds: Normal breath sounds. No stridor. No wheezing, rhonchi or rales.  Musculoskeletal: Normal range of motion.  Lymphadenopathy:     Cervical: No cervical adenopathy.  Skin:    General: Skin is warm and dry.  Neurological:     General: No focal deficit present.     Mental Status: She is alert and oriented to person, place, and time.   Psychiatric:        Mood and Affect: Mood normal.        Behavior: Behavior normal.        Thought Content: Thought content normal.        Judgment: Judgment normal.   No cough in room    Assessment & Plan:  Sinusitis Maxillary Upper Respiratory infection Meds ordered this encounter  Medications  . doxycycline (VIBRA-TABS) 100 MG tablet    Sig: Take 1 tablet (100 mg total) by mouth 2 (two) times daily.    Dispense:  14 tablet    Refill:  0  Take OTC Zyrtec or Claritin daily to help dry up mucus. Most likely elevated blood pressure due to Sudafed. Offered Chest x-ray but patient declined at this time  because she is feeling better.  Reviewed sun sensitivity with patient  taking Doxy medication. If not improving in  3-5 days to return to the clinic most likely for ENT referral or for her to follow up with her PCP. She verbalizes understanding and has no questions at discharge.  It is noted she had prednisone taper in Jan 2020 for Sciatica. And in Feb 2020 for Eustachian tube dysfunction.

## 2018-11-10 NOTE — Patient Instructions (Signed)
Upper Respiratory Infection, Adult An upper respiratory infection (URI) affects the nose, throat, and upper air passages. URIs are caused by germs (viruses). The most common type of URI is often called "the common cold." Medicines cannot cure URIs, but you can do things at home to relieve your symptoms. URIs usually get better within 7-10 days. Follow these instructions at home: Activity  Rest as needed.  If you have a fever, stay home from work or school until your fever is gone, or until your doctor says you may return to work or school. ? You should stay home until you cannot spread the infection anymore (you are not contagious). ? Your doctor may have you wear a face mask so you have less risk of spreading the infection. Relieving symptoms  Gargle with a salt-water mixture 3-4 times a day or as needed. To make a salt-water mixture, completely dissolve -1 tsp of salt in 1 cup of warm water.  Use a cool-mist humidifier to add moisture to the air. This can help you breathe more easily. Eating and drinking   Drink enough fluid to keep your pee (urine) pale yellow.  Eat soups and other clear broths. General instructions   Take over-the-counter and prescription medicines only as told by your doctor. These include cold medicines, fever reducers, and cough suppressants.  Do not use any products that contain nicotine or tobacco. These include cigarettes and e-cigarettes. If you need help quitting, ask your doctor.  Avoid being where people are smoking (avoid secondhand smoke).  Make sure you get regular shots and get the flu shot every year.  Keep all follow-up visits as told by your doctor. This is important. How to avoid spreading infection to others   Wash your hands often with soap and water. If you do not have soap and water, use hand sanitizer.  Avoid touching your mouth, face, eyes, or nose.  Cough or sneeze into a tissue or your sleeve or elbow. Do not cough or sneeze  into your hand or into the air. Contact a doctor if:  You are getting worse, not better.  You have any of these: ? A fever. ? Chills. ? Brown or red mucus in your nose. ? Yellow or brown fluid (discharge)coming from your nose. ? Pain in your face, especially when you bend forward. ? Swollen neck glands. ? Pain with swallowing. ? White areas in the back of your throat. Get help right away if:  You have shortness of breath that gets worse.  You have very bad or constant: ? Headache. ? Ear pain. ? Pain in your forehead, behind your eyes, and over your cheekbones (sinus pain). ? Chest pain.  You have long-lasting (chronic) lung disease along with any of these: ? Wheezing. ? Long-lasting cough. ? Coughing up blood. ? A change in your usual mucus.  You have a stiff neck.  You have changes in your: ? Vision. ? Hearing. ? Thinking. ? Mood. Summary  An upper respiratory infection (URI) is caused by a germ called a virus. The most common type of URI is often called "the common cold."  URIs usually get better within 7-10 days.  Take over-the-counter and prescription medicines only as told by your doctor. This information is not intended to replace advice given to you by your health care provider. Make sure you discuss any questions you have with your health care provider. Document Released: 02/20/2008 Document Revised: 04/26/2017 Document Reviewed: 04/26/2017 Elsevier Interactive Patient Education  2019 [employer] Inc[employer]   Sinusitis, Adult Sinusitis is soreness and swelling (inflammation) of your sinuses. Sinuses are hollow spaces in the bones around your face. They are located:  Around your eyes.  In the middle of your forehead.  Behind your nose.  In your cheekbones. Your sinuses and nasal passages are lined with a fluid called mucus. Mucus drains out of your sinuses. Swelling can trap mucus in your sinuses. This lets germs (bacteria, virus, or fungus) grow, which leads to  infection. Most of the time, this condition is caused by a virus. What are the causes? This condition is caused by:  Allergies.  Asthma.  Germs.  Things that block your nose or sinuses.  Growths in the nose (nasal polyps).  Chemicals or irritants in the air.  Fungus (rare). What increases the risk? You are more likely to develop this condition if:  You have a weak body defense system (immune system).  You do a lot of swimming or diving.  You use nasal sprays too much.  You smoke. What are the signs or symptoms? The main symptoms of this condition are pain and a feeling of pressure around the sinuses. Other symptoms include:  Stuffy nose (congestion).  Runny nose (drainage).  Swelling and warmth in the sinuses.  Headache.  Toothache.  A cough that may get worse at night.  Mucus that collects in the throat or the back of the nose (postnasal drip).  Being unable to smell and taste.  Being very tired (fatigue).  A fever.  Sore throat.  Bad breath. How is this diagnosed? This condition is diagnosed based on:  Your symptoms.  Your medical history.  A physical exam.  Tests to find out if your condition is short-term (acute) or long-term (chronic). Your doctor may: ? Check your nose for growths (polyps). ? Check your sinuses using a tool that has a light (endoscope). ? Check for allergies or germs. ? Do imaging tests, such as an MRI or CT scan. How is this treated? Treatment for this condition depends on the cause and whether it is short-term or long-term.  If caused by a virus, your symptoms should go away on their own within 10 days. You may be given medicines to relieve symptoms. They include: ? Medicines that shrink swollen tissue in the nose. ? Medicines that treat allergies (antihistamines). ? A spray that treats swelling of the nostrils. ? Rinses that help get rid of thick mucus in your nose (nasal saline washes).  If caused by bacteria,  your doctor may wait to see if you will get better without treatment. You may be given antibiotic medicine if you have: ? A very bad infection. ? A weak body defense system.  If caused by growths in the nose, you may need to have surgery. Follow these instructions at home: Medicines  Take, use, or apply over-the-counter and prescription medicines only as told by your doctor. These may include nasal sprays.  If you were prescribed an antibiotic medicine, take it as told by your doctor. Do not stop taking the antibiotic even if you start to feel better. Hydrate and humidify   Drink enough water to keep your pee (urine) pale yellow.  Use a cool mist humidifier to keep the humidity level in your home above 50%.  Breathe in steam for 10-15 minutes, 3-4 times a day, or as told by your doctor. You can do this in the bathroom while a hot shower is running.  Try not to spend time in cool or dry   air. Rest  Rest as much as you can.  Sleep with your head raised (elevated).  Make sure you get enough sleep each night. General instructions   Put a warm, moist washcloth on your face 3-4 times a day, or as often as told by your doctor. This will help with discomfort.  Wash your hands often with soap and water. If there is no soap and water, use hand sanitizer.  Do not smoke. Avoid being around people who are smoking (secondhand smoke).  Keep all follow-up visits as told by your doctor. This is important. Contact a doctor if:  You have a fever.  Your symptoms get worse.  Your symptoms do not get better within 10 days. Get help right away if:  You have a very bad headache.  You cannot stop throwing up (vomiting).  You have very bad pain or swelling around your face or eyes.  You have trouble seeing.  You feel confused.  Your neck is stiff.  You have trouble breathing. Summary  Sinusitis is swelling of your sinuses. Sinuses are hollow spaces in the bones around your  face.  This condition is caused by tissues in your nose that become inflamed or swollen. This traps germs. These can lead to infection.  If you were prescribed an antibiotic medicine, take it as told by your doctor. Do not stop taking it even if you start to feel better.  Keep all follow-up visits as told by your doctor. This is important. This information is not intended to replace advice given to you by your health care provider. Make sure you discuss any questions you have with your health care provider. Document Released: 02/20/2008 Document Revised: 02/03/2018 Document Reviewed: 02/03/2018 Elsevier Interactive Patient Education  2019 [employer]  

## 2019-08-03 ENCOUNTER — Other Ambulatory Visit: Payer: Self-pay

## 2019-08-03 DIAGNOSIS — Z20822 Contact with and (suspected) exposure to covid-19: Secondary | ICD-10-CM

## 2019-08-05 LAB — NOVEL CORONAVIRUS, NAA: SARS-CoV-2, NAA: NOT DETECTED

## 2019-08-06 ENCOUNTER — Encounter: Payer: BLUE CROSS/BLUE SHIELD | Admitting: Obstetrics and Gynecology

## 2019-08-10 ENCOUNTER — Other Ambulatory Visit: Payer: Self-pay

## 2019-08-10 MED ORDER — DESOGESTREL-ETHINYL ESTRADIOL 0.15-0.02/0.01 MG (21/5) PO TABS: 1.0000 | ORAL_TABLET | Freq: Every day | ORAL | 6 refills | Status: DC

## 2019-08-10 NOTE — Telephone Encounter (Signed)
Pt called and informed about  her medication refill for Mircette. Pt is aware that MS is not longer with [facility] and she needed an appt for annual exam before anymore refills would be given. Pt was given 6 months refills until she was able to schedule an appt with a provider in office. Pt stated that she would call back before then and schedule an appt for annual exam.

## 2019-08-11 ENCOUNTER — Ambulatory Visit (INDEPENDENT_AMBULATORY_CARE_PROVIDER_SITE_OTHER): Payer: BC Managed Care – PPO | Admitting: Internal Medicine

## 2019-08-11 ENCOUNTER — Encounter: Payer: Self-pay | Admitting: Internal Medicine

## 2019-08-11 DIAGNOSIS — F32A Depression, unspecified: Secondary | ICD-10-CM

## 2019-08-11 DIAGNOSIS — Z3041 Encounter for surveillance of contraceptive pills: Secondary | ICD-10-CM | POA: Diagnosis not present

## 2019-08-11 DIAGNOSIS — F329 Major depressive disorder, single episode, unspecified: Secondary | ICD-10-CM

## 2019-08-11 DIAGNOSIS — F419 Anxiety disorder, unspecified: Secondary | ICD-10-CM

## 2019-08-11 MED ORDER — ESCITALOPRAM OXALATE 20 MG PO TABS: 20.0000 mg | ORAL_TABLET | Freq: Every day | ORAL | 0 refills | Status: DC

## 2019-08-11 MED ORDER — DESOGESTREL-ETHINYL ESTRADIOL 0.15-0.02/0.01 MG (21/5) PO TABS: 1.0000 | ORAL_TABLET | Freq: Every day | ORAL | 0 refills | Status: DC

## 2019-08-11 NOTE — Patient Instructions (Signed)

## 2019-08-11 NOTE — Progress Notes (Signed)
Virtual Visit via Video Note  I connected with Caroline Gonzalez on 08/11/19 at  3:45 PM EST by a video enabled telemedicine application and verified that I am speaking with the correct person using two identifiers.  Location: Patient: Work Provider: Office   I discussed the limitations of evaluation and management by telemedicine and the availability of in person appointments. The patient expressed understanding and agreed to proceed.  History of Present Illness:  Pt requesting refill of birth control. Her GYN that was prescribing this for her changed practices. Her periods are regular. She denies PMS symptoms. Pap smear from 07/2017 reviewed.  She would also like a refill of Esciatalopram. She takes this for anxiety and depression. She reports her symptoms are well controlled on this medication. She is not currently seeing a therapist. She denies SI/HI.   Past Medical History:  Diagnosis Date  . Anxiety   . Contraception   . Depression   . Sleep disturbance     Current Outpatient Medications  Medication Sig Dispense Refill  . albuterol (PROVENTIL HFA;VENTOLIN HFA) 108 (90 Base) MCG/ACT inhaler Inhale 2 puffs into the lungs every 6 (six) hours as needed for wheezing or shortness of breath. 1 Inhaler 0  . benzonatate (TESSALON PERLES) 100 MG capsule Take 1 capsule (100 mg total) by mouth 3 (three) times daily as needed. 30 capsule 0  . desogestrel-ethinyl estradiol (MIRCETTE) 0.15-0.02/0.01 MG (21/5) tablet Take 1 tablet by mouth daily. 1 Package 6  . doxycycline (VIBRA-TABS) 100 MG tablet Take 1 tablet (100 mg total) by mouth 2 (two) times daily. 14 tablet 0  . escitalopram (LEXAPRO) 20 MG tablet Take 1 tablet (20 mg total) by mouth daily. 90 tablet 4  . hydrOXYzine (ATARAX/VISTARIL) 10 MG tablet Take 1 tablet (10 mg total) by mouth daily as needed. 30 tablet 0  . pseudoephedrine (SUDAFED) 30 MG tablet Take 30 mg by mouth every 4 (four) hours as needed for congestion.     No  current facility-administered medications for this visit.     Allergies  Allergen Reactions  . Codeine     REACTION: hallucinations  . Morphine     REACTION: hallucinations  . Shrimp [Shellfish Allergy] Other (See Comments)    Vomiting, fever  . Sulfa Antibiotics   . Tamiflu [Oseltamivir Phosphate]     Family History  Problem Relation Age of Onset  . Diabetes Paternal Grandfather   . Breast cancer Paternal Grandmother   . Breast cancer Paternal Aunt   . Hypertension Mother   . Hearing loss Maternal Aunt   . Hearing loss Maternal Uncle   . Heart disease Maternal Grandmother   . Heart disease Maternal Grandfather     Social History   Socioeconomic History  . Marital status: Single    Spouse name: Not on file  . Number of children: Not on file  . Years of education: Not on file  . Highest education level: Not on file  Occupational History  . Not on file  Social Needs  . Financial resource strain: Not on file  . Food insecurity    Worry: Not on file    Inability: Not on file  . Transportation needs    Medical: Not on file    Non-medical: Not on file  Tobacco Use  . Smoking status: Never Smoker  . Smokeless tobacco: Never Used  Substance and Sexual Activity  . Alcohol use: Yes    Alcohol/week: 0.0 standard drinks    Comment: social  .  Drug use: No    Comment: THC--recently quit  . Sexual activity: Yes  Lifestyle  . Physical activity    Days per week: Not on file    Minutes per session: Not on file  . Stress: Not on file  Relationships  . Social Musician on phone: Not on file    Gets together: Not on file    Attends religious service: Not on file    Active member of club or organization: Not on file    Attends meetings of clubs or organizations: Not on file    Relationship status: Not on file  . Intimate partner violence    Fear of current or ex partner: Not on file    Emotionally abused: Not on file    Physically abused: Not on file     Forced sexual activity: Not on file  Other Topics Concern  . Not on file  Social History Narrative  . Not on file     Constitutional: Denies fever, malaise, fatigue, headache or abrupt weight changes.  Respiratory: Denies difficulty breathing, shortness of breath, cough or sputum production.   Cardiovascular: Denies chest pain, chest tightness, palpitations or swelling in the hands or feet.  Gastrointestinal: Denies abdominal pain, bloating, constipation, diarrhea or blood in the stool.  GU: Denies urgency, frequency, pain with urination, burning sensation, blood in urine, odor or discharge. Psych: Pt has a history of anxiety and depression. Denies SI/HI.  No other specific complaints in a complete review of systems (except as listed in HPI above).  Observations/Objective:  LMP 07/20/2019   Wt Readings from Last 3 Encounters:  11/10/18 155 lb 3.2 oz (70.4 kg)  10/28/18 154 lb 3.2 oz (69.9 kg)  10/10/18 151 lb (68.5 kg)    General: Appears her stated age, well developed, well nourished in NAD. Pulmonary/Chest: Normal efforts. No respiratory distress.  Neurological: Alert and oriented. Psychiatric: Mood and affect normal. Behavior is normal. Judgment and thought content normal.     BMET    Component Value Date/Time   NA 144 07/19/2017 1704   NA 140 11/30/2012 2156   K 4.2 07/19/2017 1704   K 3.5 11/30/2012 2156   CL 104 07/19/2017 1704   CL 106 11/30/2012 2156   CO2 26 07/19/2017 1704   CO2 28 (H) 11/30/2012 2156   GLUCOSE 87 07/19/2017 1704   GLUCOSE 85 11/30/2012 2156   BUN 13 07/19/2017 1704   BUN 7 (L) 11/30/2012 2156   CREATININE 0.83 07/19/2017 1704   CREATININE 0.61 11/30/2012 2156   CALCIUM 9.7 07/19/2017 1704   CALCIUM 9.1 11/30/2012 2156   GFRNONAA 101 07/19/2017 1704   GFRAA 117 07/19/2017 1704    Lipid Panel  No results found for: CHOL, TRIG, HDL, CHOLHDL, VLDL, LDLCALC  CBC    Component Value Date/Time   WBC 6.5 07/19/2017 1704   WBC 13.9 (H)  09/06/2014 0938   RBC 4.67 07/19/2017 1704   RBC 4.79 09/06/2014 0938   HGB 13.4 07/19/2017 1704   HCT 40.3 07/19/2017 1704   PLT 326 07/19/2017 1704   MCV 86 07/19/2017 1704   MCV 88 09/06/2014 0938   MCH 28.7 07/19/2017 1704   MCH 28.8 09/06/2014 0938   MCHC 33.3 07/19/2017 1704   MCHC 32.6 09/06/2014 0938   RDW 13.1 07/19/2017 1704   RDW 13.1 09/06/2014 0938   LYMPHSABS 2.3 09/06/2014 0938   MONOABS 1.5 (H) 09/06/2014 0938   EOSABS 0.1 09/06/2014 0938   BASOSABS  0.1 09/06/2014 0938    Hgb A1C No results found for: HGBA1C     Assessment and Plan:  Encounter for Management of Contraceptive Medication:  OCP refilled today  Advised her to schedule an appt for her annual exam  Follow Up Instructions:    I discussed the assessment and treatment plan with the patient. The patient was provided an opportunity to ask questions and all were answered. The patient agreed with the plan and demonstrated an understanding of the instructions.   The patient was advised to call back or seek an in-person evaluation if the symptoms worsen or if the condition fails to improve as anticipated.    Nicki Reaperegina Baity, NP

## 2019-08-11 NOTE — Assessment & Plan Note (Signed)
Support offered today Escitalopram refilled Will monitor

## 2019-11-23 ENCOUNTER — Ambulatory Visit (INDEPENDENT_AMBULATORY_CARE_PROVIDER_SITE_OTHER): Payer: BC Managed Care – PPO | Admitting: Internal Medicine

## 2019-11-23 ENCOUNTER — Other Ambulatory Visit (HOSPITAL_COMMUNITY)
Admission: RE | Admit: 2019-11-23 | Discharge: 2019-11-23 | Disposition: A | Discharge status: Home / Self Care | Payer: BC Managed Care – PPO | Source: Ambulatory Visit | Attending: Internal Medicine | Admitting: Internal Medicine

## 2019-11-23 ENCOUNTER — Encounter: Payer: Self-pay | Admitting: Internal Medicine

## 2019-11-23 ENCOUNTER — Other Ambulatory Visit: Payer: Self-pay

## 2019-11-23 VITALS — BP 122/82 | HR 78 | Temp 97.5°F | Ht 67.33 in | Wt 153.0 lb

## 2019-11-23 DIAGNOSIS — Z23 Encounter for immunization: Secondary | ICD-10-CM

## 2019-11-23 DIAGNOSIS — Z0000 Encounter for general adult medical examination without abnormal findings: Secondary | ICD-10-CM | POA: Diagnosis not present

## 2019-11-23 DIAGNOSIS — Z113 Encounter for screening for infections with a predominantly sexual mode of transmission: Secondary | ICD-10-CM | POA: Insufficient documentation

## 2019-11-23 DIAGNOSIS — Z124 Encounter for screening for malignant neoplasm of cervix: Secondary | ICD-10-CM | POA: Diagnosis not present

## 2019-11-23 DIAGNOSIS — F329 Major depressive disorder, single episode, unspecified: Secondary | ICD-10-CM | POA: Diagnosis not present

## 2019-11-23 DIAGNOSIS — F419 Anxiety disorder, unspecified: Secondary | ICD-10-CM

## 2019-11-23 DIAGNOSIS — F32A Depression, unspecified: Secondary | ICD-10-CM

## 2019-11-23 NOTE — Assessment & Plan Note (Signed)
Stable on Escitalopram Support offered today

## 2019-11-23 NOTE — Progress Notes (Signed)
Subjective:    Patient ID: Caroline Gonzalez, female    DOB: Jun 22, 1996, 24 y.o.   MRN: [id number]  HPI  Pt presents to the clinic today for her annual exam.  Anxiety and Depression: Deteriorated due to recent break up with her baby's father. Managed on Escitalopram. She is not currently seeing a therapist. She denies SI/HI.  Flu: 07/2018 Tetanus: 03/2007 Pap Smear: 07/2017 Dentist: biannually  Diet: She does eat meat. She consumes fruits and veggies daily. She does eat some fried foods. She drinks mostly water. Exercise: Cross train for 2 hours 6 day per week.  Review of Systems      Past Medical History:  Diagnosis Date  . Anxiety   . Contraception   . Depression   . Sleep disturbance     Current Outpatient Medications  Medication Sig Dispense Refill  . desogestrel-ethinyl estradiol (MIRCETTE) 0.15-0.02/0.01 MG (21/5) tablet Take 1 tablet by mouth daily. 3 Package 0  . escitalopram (LEXAPRO) 20 MG tablet Take 1 tablet (20 mg total) by mouth daily. 90 tablet 0   No current facility-administered medications for this visit.    Allergies  Allergen Reactions  . Codeine     REACTION: hallucinations  . Morphine     REACTION: hallucinations  . Shrimp [Shellfish Allergy] Other (See Comments)    Vomiting, fever  . Sulfa Antibiotics   . Tamiflu [Oseltamivir Phosphate]     Family History  Problem Relation Age of Onset  . Diabetes Paternal Grandfather   . Breast cancer Paternal Grandmother   . Breast cancer Paternal Aunt   . Hypertension Mother   . Hearing loss Maternal Aunt   . Hearing loss Maternal Uncle   . Heart disease Maternal Grandmother   . Heart disease Maternal Grandfather     Social History   Socioeconomic History  . Marital status: Single    Spouse name: Not on file  . Number of children: Not on file  . Years of education: Not on file  . Highest education level: Not on file  Occupational History  . Not on file  Tobacco Use  . Smoking  status: Never Smoker  . Smokeless tobacco: Never Used  Substance and Sexual Activity  . Alcohol use: Yes    Alcohol/week: 0.0 standard drinks    Comment: social  . Drug use: No    Comment: THC--recently quit  . Sexual activity: Yes  Other Topics Concern  . Not on file  Social History Narrative  . Not on file   Social Determinants of Health   Financial Resource Strain:   . Difficulty of Paying Living Expenses: Not on file  Food Insecurity:   . Worried About Programme researcher, broadcasting/film/video in the Last Year: Not on file  . Ran Out of Food in the Last Year: Not on file  Transportation Needs:   . Lack of Transportation (Medical): Not on file  . Lack of Transportation (Non-Medical): Not on file  Physical Activity:   . Days of Exercise per Week: Not on file  . Minutes of Exercise per Session: Not on file  Stress:   . Feeling of Stress : Not on file  Social Connections:   . Frequency of Communication with Friends and Family: Not on file  . Frequency of Social Gatherings with Friends and Family: Not on file  . Attends Religious Services: Not on file  . Active Member of Clubs or Organizations: Not on file  . Attends Banker Meetings: Not  on file  . Marital Status: Not on file  Intimate Partner Violence:   . Fear of Current or Ex-Partner: Not on file  . Emotionally Abused: Not on file  . Physically Abused: Not on file  . Sexually Abused: Not on file     Constitutional: Denies fever, malaise, fatigue, headache or abrupt weight changes.  HEENT: Denies eye pain, eye redness, ear pain, ringing in the ears, wax buildup, runny nose, nasal congestion, bloody nose, or sore throat. Respiratory: Denies difficulty breathing, shortness of breath, cough or sputum production.   Cardiovascular: Denies chest pain, chest tightness, palpitations or swelling in the hands or feet.  Gastrointestinal: Denies abdominal pain, bloating, constipation, diarrhea or blood in the stool.  GU: Denies urgency,  frequency, pain with urination, burning sensation, blood in urine, odor or discharge. Musculoskeletal: Denies decrease in range of motion, difficulty with gait, muscle pain or joint pain and swelling.  Skin: Denies redness, rashes, lesions or ulcercations.  Neurological: Denies dizziness, difficulty with memory, difficulty with speech or problems with balance and coordination.  Psych: Pt has a history of anxiety and depression. Denies SI/HI.  No other specific complaints in a complete review of systems (except as listed in HPI above).  Objective:   Physical Exam   BP 122/82   Pulse 78   Temp (!) 97.5 F (36.4 C) (Temporal)   Ht 5' 7.33" (1.71 m)   Wt 153 lb (69.4 kg)   LMP 10/26/2019   SpO2 98%   BMI 23.73 kg/m   Wt Readings from Last 3 Encounters:  11/10/18 155 lb 3.2 oz (70.4 kg)  10/28/18 154 lb 3.2 oz (69.9 kg)  10/10/18 151 lb (68.5 kg)    General: Appears her stated age, well developed, well nourished in NAD. Skin: Warm, dry and intact. No rashes, lesions or ulcerations noted. HEENT: Head: normal shape and size; Eyes: sclera white, no icterus, conjunctiva pink, PERRLA and EOMs intact; Ears: Tm's gray and intact, normal light reflex;  Neck:  Neck supple, trachea midline. No masses, lumps or thyromegaly present.  Cardiovascular: Normal rate and rhythm. S1,S2 noted.  No murmur, rubs or gallops noted. No JVD or BLE edema.  Pulmonary/Chest: Normal effort and positive vesicular breath sounds. No respiratory distress. No wheezes, rales or ronchi noted.  Abdomen: Soft and nontender. Normal bowel sounds. No distention or masses noted. Liver, spleen and kidneys non palpable. Pelvic: Normal female anatomy. Cervix without changes. Small amount of old bloody discharge noted. No CMT tenderness noted. Adnexa non palpable. Musculoskeletal: Strength 5/5 BUE/BLE. No difficulty with gait.  Neurological: Alert and oriented. Cranial nerves II-XII grossly intact. Coordination normal.    Psychiatric: Mood and affect normal. Behavior is normal. Judgment and thought content normal.     BMET    Component Value Date/Time   NA 144 07/19/2017 1704   NA 140 11/30/2012 2156   K 4.2 07/19/2017 1704   K 3.5 11/30/2012 2156   CL 104 07/19/2017 1704   CL 106 11/30/2012 2156   CO2 26 07/19/2017 1704   CO2 28 (H) 11/30/2012 2156   GLUCOSE 87 07/19/2017 1704   GLUCOSE 85 11/30/2012 2156   BUN 13 07/19/2017 1704   BUN 7 (L) 11/30/2012 2156   CREATININE 0.83 07/19/2017 1704   CREATININE 0.61 11/30/2012 2156   CALCIUM 9.7 07/19/2017 1704   CALCIUM 9.1 11/30/2012 2156   GFRNONAA 101 07/19/2017 1704   GFRAA 117 07/19/2017 1704    Lipid Panel  No results found for: CHOL, TRIG,  HDL, CHOLHDL, VLDL, LDLCALC  CBC    Component Value Date/Time   WBC 6.5 07/19/2017 1704   WBC 13.9 (H) 09/06/2014 0938   RBC 4.67 07/19/2017 1704   RBC 4.79 09/06/2014 0938   HGB 13.4 07/19/2017 1704   HCT 40.3 07/19/2017 1704   PLT 326 07/19/2017 1704   MCV 86 07/19/2017 1704   MCV 88 09/06/2014 0938   MCH 28.7 07/19/2017 1704   MCH 28.8 09/06/2014 0938   MCHC 33.3 07/19/2017 1704   MCHC 32.6 09/06/2014 0938   RDW 13.1 07/19/2017 1704   RDW 13.1 09/06/2014 0938   LYMPHSABS 2.3 09/06/2014 0938   MONOABS 1.5 (H) 09/06/2014 0938   EOSABS 0.1 09/06/2014 0938   BASOSABS 0.1 09/06/2014 0938    Hgb A1C No results found for: HGBA1C         Assessment & Plan:   Preventative Health Maintenance:  Encouraged her to get a flu shot in the fall Tetanus today Pap smear today with STD screening Encouraged her to consume a balanced diet and exercise regimen Advised her to see a dentist annually Will check CBC, CMET, Lipid profile today  RTC in 1 year, sooner if needed Webb Silversmith, NP This visit occurred during the SARS-CoV-2 public health emergency.  Safety protocols were in place, including screening questions prior to the visit, additional usage of staff PPE, and extensive cleaning of  exam room while observing appropriate contact time as indicated for disinfecting solutions.

## 2019-11-23 NOTE — Patient Instructions (Signed)
Health Maintenance, Female Adopting a healthy lifestyle and getting preventive care are important in promoting health and wellness. Ask your health care provider about:  The right schedule for you to have regular tests and exams.  Things you can do on your own to prevent diseases and keep yourself healthy. What should I know about diet, weight, and exercise? Eat a healthy diet   Eat a diet that includes plenty of vegetables, fruits, low-fat dairy products, and lean protein.  Do not eat a lot of foods that are high in solid fats, added sugars, or sodium. Maintain a healthy weight Body mass index (BMI) is used to identify weight problems. It estimates body fat based on height and weight. Your health care provider can help determine your BMI and help you achieve or maintain a healthy weight. Get regular exercise Get regular exercise. This is one of the most important things you can do for your health. Most adults should:  Exercise for at least 150 minutes each week. The exercise should increase your heart rate and make you sweat (moderate-intensity exercise).  Do strengthening exercises at least twice a week. This is in addition to the moderate-intensity exercise.  Spend less time sitting. Even light physical activity can be beneficial. Watch cholesterol and blood lipids Have your blood tested for lipids and cholesterol at 25 years of age, then have this test every 5 years. Have your cholesterol levels checked more often if:  Your lipid or cholesterol levels are high.  You are older than 25 years of age.  You are at high risk for heart disease. What should I know about cancer screening? Depending on your health history and family history, you may need to have cancer screening at various ages. This may include screening for:  Breast cancer.  Cervical cancer.  Colorectal cancer.  Skin cancer.  Lung cancer. What should I know about heart disease, diabetes, and high blood  pressure? Blood pressure and heart disease  High blood pressure causes heart disease and increases the risk of stroke. This is more likely to develop in people who have high blood pressure readings, are of African descent, or are overweight.  Have your blood pressure checked: ? Every 3-5 years if you are 18-39 years of age. ? Every year if you are 40 years old or older. Diabetes Have regular diabetes screenings. This checks your fasting blood sugar level. Have the screening done:  Once every three years after age 40 if you are at a normal weight and have a low risk for diabetes.  More often and at a younger age if you are overweight or have a high risk for diabetes. What should I know about preventing infection? Hepatitis B If you have a higher risk for hepatitis B, you should be screened for this virus. Talk with your health care provider to find out if you are at risk for hepatitis B infection. Hepatitis C Testing is recommended for:  Everyone born from 1945 through 1965.  Anyone with known risk factors for hepatitis C. Sexually transmitted infections (STIs)  Get screened for STIs, including gonorrhea and chlamydia, if: ? You are sexually active and are younger than 24 years of age. ? You are older than 24 years of age and your health care provider tells you that you are at risk for this type of infection. ? Your sexual activity has changed since you were last screened, and you are at increased risk for chlamydia or gonorrhea. Ask your health care provider if   you are at risk.  Ask your health care provider about whether you are at high risk for HIV. Your health care provider may recommend a prescription medicine to help prevent HIV infection. If you choose to take medicine to prevent HIV, you should first get tested for HIV. You should then be tested every 3 months for as long as you are taking the medicine. Pregnancy  If you are about to stop having your period (premenopausal) and  you may become pregnant, seek counseling before you get pregnant.  Take 400 to 800 micrograms (mcg) of folic acid every day if you become pregnant.  Ask for birth control (contraception) if you want to prevent pregnancy. Osteoporosis and menopause Osteoporosis is a disease in which the bones lose minerals and strength with aging. This can result in bone fractures. If you are 65 years old or older, or if you are at risk for osteoporosis and fractures, ask your health care provider if you should:  Be screened for bone loss.  Take a calcium or vitamin D supplement to lower your risk of fractures.  Be given hormone replacement therapy (HRT) to treat symptoms of menopause. Follow these instructions at home: Lifestyle  Do not use any products that contain nicotine or tobacco, such as cigarettes, e-cigarettes, and chewing tobacco. If you need help quitting, ask your health care provider.  Do not use street drugs.  Do not share needles.  Ask your health care provider for help if you need support or information about quitting drugs. Alcohol use  Do not drink alcohol if: ? Your health care provider tells you not to drink. ? You are pregnant, may be pregnant, or are planning to become pregnant.  If you drink alcohol: ? Limit how much you use to 0-1 drink a day. ? Limit intake if you are breastfeeding.  Be aware of how much alcohol is in your drink. In the [place] one drink equals one 12 oz bottle of beer (355 mL), one 5 oz glass of wine (148 mL), or one 1 oz glass of hard liquor (44 mL). General instructions  Schedule regular health, dental, and eye exams.  Stay current with your vaccines.  Tell your health care provider if: ? You often feel depressed. ? You have ever been abused or do not feel safe at home. Summary  Adopting a healthy lifestyle and getting preventive care are important in promoting health and wellness.  Follow your health care provider's instructions about healthy  diet, exercising, and getting tested or screened for diseases.  Follow your health care provider's instructions on monitoring your cholesterol and blood pressure. This information is not intended to replace advice given to you by your health care provider. Make sure you discuss any questions you have with your health care provider. Document Revised: 08/27/2018 Document Reviewed: 08/27/2018 Elsevier Patient Education  2020 [employer] Inc[employer]  

## 2019-11-23 NOTE — Addendum Note (Signed)
Addended by: Roena Malady on: 11/23/2019 04:47 PM   Modules accepted: Orders

## 2019-11-24 LAB — COMPREHENSIVE METABOLIC PANEL
ALT: 19 U/L (ref 0–35)
AST: 18 U/L (ref 0–37)
Albumin: 4.7 g/dL (ref 3.5–5.2)
Alkaline Phosphatase: 48 U/L (ref 39–117)
BUN: 16 mg/dL (ref 6–23)
CO2: 30 mEq/L (ref 19–32)
Calcium: 10.1 mg/dL (ref 8.4–10.5)
Chloride: 104 mEq/L (ref 96–112)
Creatinine, Ser: 0.96 mg/dL (ref 0.40–1.20)
GFR: 71.21 mL/min (ref >60.00)
Glucose, Bld: 97 mg/dL (ref 70–99)
Potassium: 4.5 mEq/L (ref 3.5–5.1)
Sodium: 140 mEq/L (ref 135–145)
Total Bilirubin: 0.4 mg/dL (ref 0.2–1.2)
Total Protein: 7.2 g/dL (ref 6.0–8.3)

## 2019-11-24 LAB — CBC
HCT: 41.5 % (ref 36.0–46.0)
Hemoglobin: 14.2 g/dL (ref 12.0–15.0)
MCHC: 34.1 g/dL (ref 30.0–36.0)
MCV: 87.4 fl (ref 78.0–100.0)
Platelets: 323 10*3/uL (ref 150.0–400.0)
RBC: 4.75 Mil/uL (ref 3.87–5.11)
RDW: 12.6 % (ref 11.5–15.5)
WBC: 10.6 10*3/uL — ABNORMAL HIGH (ref 4.0–10.5)

## 2019-11-24 LAB — LIPID PANEL
Cholesterol: 240 mg/dL — ABNORMAL HIGH (ref 0–200)
HDL: 75.1 mg/dL (ref >39.00)
LDL Cholesterol: 144 mg/dL — ABNORMAL HIGH (ref 0–99)
NonHDL: 165.38
Total CHOL/HDL Ratio: 3
Triglycerides: 108 mg/dL (ref 0.0–149.0)
VLDL: 21.6 mg/dL (ref 0.0–40.0)

## 2019-11-26 LAB — CYTOLOGY - PAP
Chlamydia: NEGATIVE
Comment: NEGATIVE
Comment: NEGATIVE
Comment: NORMAL
Diagnosis: NEGATIVE
Neisseria Gonorrhea: NEGATIVE
Trichomonas: NEGATIVE

## 2020-01-14 ENCOUNTER — Telehealth (INDEPENDENT_AMBULATORY_CARE_PROVIDER_SITE_OTHER): Payer: BC Managed Care – PPO | Admitting: Internal Medicine

## 2020-01-14 ENCOUNTER — Encounter: Payer: Self-pay | Admitting: Internal Medicine

## 2020-01-14 DIAGNOSIS — J301 Allergic rhinitis due to pollen: Secondary | ICD-10-CM | POA: Diagnosis not present

## 2020-01-14 NOTE — Patient Instructions (Signed)
Allergic Rhinitis, Adult Allergic rhinitis is a reaction to allergens in the air. Allergens are tiny specks (particles) in the air that cause your body to have an allergic reaction. This condition cannot be passed from person to person (is not contagious). Allergic rhinitis cannot be cured, but it can be controlled. There are two types of allergic rhinitis:  Seasonal. This type is also called hay fever. It happens only during certain times of the year.  Perennial. This type can happen at any time of the year. What are the causes? This condition may be caused by:  Pollen from grasses, trees, and weeds.  House dust mites.  Pet dander.  Mold. What are the signs or symptoms? Symptoms of this condition include:  Sneezing.  Runny or stuffy nose (nasal congestion).  A lot of mucus in the back of the throat (postnasal drip).  Itchy nose.  Tearing of the eyes.  Trouble sleeping.  Being sleepy during day. How is this treated? There is no cure for this condition. You should avoid things that trigger your symptoms (allergens). Treatment can help to relieve symptoms. This may include:  Medicines that block allergy symptoms, such as antihistamines. These may be given as a shot, nasal spray, or pill.  Shots that are given until your body becomes less sensitive to the allergen (desensitization).  Stronger medicines, if all other treatments have not worked. Follow these instructions at home: Avoiding allergens   Find out what you are allergic to. Common allergens include smoke, dust, and pollen.  Avoid them if you can. These are some of the things that you can do to avoid allergens: ? Replace carpet with wood, tile, or vinyl flooring. Carpet can trap dander and dust. ? Clean any mold found in the home. ? Do not smoke. Do not allow smoking in your home. ? Change your heating and air conditioning filter at least once a month. ? During allergy season:  Keep windows closed as much as  you can. If possible, use air conditioning when there is a lot of pollen in the air.  Use a special filter for allergies with your furnace and air conditioner.  Plan outdoor activities when pollen counts are lowest. This is usually during the early morning or evening hours.  If you do go outdoors when pollen count is high, wear a special mask for people with allergies.  When you come indoors, take a shower and change your clothes before sitting on furniture or bedding. General instructions  Do not use fans in your home.  Do not hang clothes outside to dry.  Wear sunglasses to keep pollen out of your eyes.  Wash your hands right away after you touch household pets.  Take over-the-counter and prescription medicines only as told by your doctor.  Keep all follow-up visits as told by your doctor. This is important. Contact a doctor if:  You have a fever.  You have a cough that does not go away (is persistent).  You start to make whistling sounds when you breathe (wheeze).  Your symptoms do not get better with treatment.  You have thick fluid coming from your nose.  You start to have nosebleeds. Get help right away if:  Your tongue or your lips are swollen.  You have trouble breathing.  You feel dizzy or you feel like you are going to pass out (faint).  You have cold sweats. Summary  Allergic rhinitis is a reaction to allergens in the air.  This condition may be   caused by allergens. These include pollen, dust mites, pet dander, and mold.  Symptoms include a runny, itchy nose, sneezing, or tearing eyes. You may also have trouble sleeping or feel sleepy during the day.  Treatment includes taking medicines and avoiding allergens. You may also get shots or take stronger medicines.  Get help if you have a fever or a cough that does not stop. Get help right away if you are short of breath. This information is not intended to replace advice given to you by your health care  provider. Make sure you discuss any questions you have with your health care provider. Document Revised: 12/23/2018 Document Reviewed: 03/25/2018 Elsevier Patient Education  2020 Elsevier Inc.  

## 2020-01-14 NOTE — Progress Notes (Signed)
Virtual Visit via Video Note  I connected with Caroline Gonzalez on 01/14/20 at 11:30 AM EDT by a video enabled telemedicine application and verified that I am speaking with the correct person using two identifiers.  Location: Patient: Home Provider: Office   I discussed the limitations of evaluation and management by telemedicine and the availability of in person appointments. The patient expressed understanding and agreed to proceed.  History of Present Illness:  Pt reports eye drainage, nasal congestion, ear fullness, sore throat and cough. This started 4 days ago. She reports light green drainage from her eyes but denies eye redness, eye pain or visual changes. She is blowing clear mucous out of her nose. She is having some difficulty swallowing. The cough is non productive. She denies headache, dizziness, runny nose, loss of taste or smell or SOB. She denies fever, chills or body aches. She has tried Zyrtec OTC without any relief. She has not had sick contacts or exposure to Covid that she is aware of.    Past Medical History:  Diagnosis Date  . Anxiety   . Contraception   . Depression   . Sleep disturbance     Current Outpatient Medications  Medication Sig Dispense Refill  . desogestrel-ethinyl estradiol (MIRCETTE) 0.15-0.02/0.01 MG (21/5) tablet Take 1 tablet by mouth daily. 3 Package 0  . escitalopram (LEXAPRO) 20 MG tablet Take 1 tablet (20 mg total) by mouth daily. 90 tablet 0   No current facility-administered medications for this visit.    Allergies  Allergen Reactions  . Codeine     REACTION: hallucinations  . Morphine     REACTION: hallucinations  . Shrimp [Shellfish Allergy] Other (See Comments)    Vomiting, fever  . Sulfa Antibiotics   . Tamiflu [Oseltamivir Phosphate]     Family History  Problem Relation Age of Onset  . Diabetes Paternal Grandfather   . Breast cancer Paternal Grandmother   . Breast cancer Paternal Aunt   . Hypertension Mother   .  Hearing loss Maternal Aunt   . Hearing loss Maternal Uncle   . Heart disease Maternal Grandmother   . Heart disease Maternal Grandfather     Social History   Socioeconomic History  . Marital status: Single    Spouse name: Not on file  . Number of children: Not on file  . Years of education: Not on file  . Highest education level: Not on file  Occupational History  . Not on file  Tobacco Use  . Smoking status: Never Smoker  . Smokeless tobacco: Never Used  Substance and Sexual Activity  . Alcohol use: Yes    Alcohol/week: 0.0 standard drinks    Comment: social  . Drug use: No    Comment: THC--recently quit  . Sexual activity: Yes  Other Topics Concern  . Not on file  Social History Narrative  . Not on file   Social Determinants of Health   Financial Resource Strain:   . Difficulty of Paying Living Expenses:   Food Insecurity:   . Worried About Programme researcher, broadcasting/film/video in the Last Year:   . Barista in the Last Year:   Transportation Needs:   . Freight forwarder (Medical):   Marland Kitchen Lack of Transportation (Non-Medical):   Physical Activity:   . Days of Exercise per Week:   . Minutes of Exercise per Session:   Stress:   . Feeling of Stress :   Social Connections:   . Frequency of Communication  with Friends and Family:   . Frequency of Social Gatherings with Friends and Family:   . Attends Religious Services:   . Active Member of Clubs or Organizations:   . Attends Archivist Meetings:   Marland Kitchen Marital Status:   Intimate Partner Violence:   . Fear of Current or Ex-Partner:   . Emotionally Abused:   Marland Kitchen Physically Abused:   . Sexually Abused:      Constitutional: Denies fever, malaise, fatigue, headache or abrupt weight changes.  HEENT: Pt reports eye discharge, nasal congestion, ear fullness and sore throat. Denies eye pain, eye redness, ringing in the ears, wax buildup, runny nose, bloody nose. Respiratory: Pt reports cough. Denies difficulty breathing,  shortness of breath,or sputum production.   Cardiovascular: Denies chest pain, chest tightness, palpitations or swelling in the hands or feet.   No other specific complaints in a complete review of systems (except as listed in HPI above).  Observations/Objective:   Wt Readings from Last 3 Encounters:  11/23/19 153 lb (69.4 kg)  11/10/18 155 lb 3.2 oz (70.4 kg)  10/28/18 154 lb 3.2 oz (69.9 kg)    General: Appears her  stated age, well developed, well nourished in NAD. HEENT: Head: normal shape and size, no sinus pressure noted; Eyes: no eye redness noted;  Nose: congested; Throat/Mouth: hoarse.  Pulmonary/Chest: Normal effort. No respiratory distress.  Neurological: Alert and oriented.   BMET    Component Value Date/Time   NA 140 11/23/2019 1613   NA 144 07/19/2017 1704   NA 140 11/30/2012 2156   K 4.5 11/23/2019 1613   K 3.5 11/30/2012 2156   CL 104 11/23/2019 1613   CL 106 11/30/2012 2156   CO2 30 11/23/2019 1613   CO2 28 (H) 11/30/2012 2156   GLUCOSE 97 11/23/2019 1613   GLUCOSE 85 11/30/2012 2156   BUN 16 11/23/2019 1613   BUN 13 07/19/2017 1704   BUN 7 (L) 11/30/2012 2156   CREATININE 0.96 11/23/2019 1613   CREATININE 0.61 11/30/2012 2156   CALCIUM 10.1 11/23/2019 1613   CALCIUM 9.1 11/30/2012 2156   GFRNONAA 101 07/19/2017 1704   GFRAA 117 07/19/2017 1704    Lipid Panel     Component Value Date/Time   CHOL 240 (H) 11/23/2019 1613   TRIG 108.0 11/23/2019 1613   HDL 75.10 11/23/2019 1613   CHOLHDL 3 11/23/2019 1613   VLDL 21.6 11/23/2019 1613   LDLCALC 144 (H) 11/23/2019 1613    CBC    Component Value Date/Time   WBC 10.6 (H) 11/23/2019 1613   RBC 4.75 11/23/2019 1613   HGB 14.2 11/23/2019 1613   HGB 13.4 07/19/2017 1704   HCT 41.5 11/23/2019 1613   HCT 40.3 07/19/2017 1704   PLT 323.0 11/23/2019 1613   PLT 326 07/19/2017 1704   MCV 87.4 11/23/2019 1613   MCV 86 07/19/2017 1704   MCV 88 09/06/2014 0938   MCH 28.7 07/19/2017 1704   MCH 28.8  09/06/2014 0938   MCHC 34.1 11/23/2019 1613   RDW 12.6 11/23/2019 1613   RDW 13.1 07/19/2017 1704   RDW 13.1 09/06/2014 0938   LYMPHSABS 2.3 09/06/2014 0938   MONOABS 1.5 (H) 09/06/2014 0938   EOSABS 0.1 09/06/2014 0938   BASOSABS 0.1 09/06/2014 0938    Hgb A1C No results found for: HGBA1C      Assessment and Plan:  Allergic Rhinitis:  Continue Zyrtec Add Flonase If no improvement by Monday, consider steroids vs abx  Return precautions discussed  Follow Up Instructions:    I discussed the assessment and treatment plan with the patient. The patient was provided an opportunity to ask questions and all were answered. The patient agreed with the plan and demonstrated an understanding of the instructions.   The patient was advised to call back or seek an in-person evaluation if the symptoms worsen or if the condition fails to improve as anticipated.    Webb Silversmith, NP

## 2020-01-25 ENCOUNTER — Other Ambulatory Visit: Payer: Self-pay | Admitting: Internal Medicine

## 2020-04-10 DIAGNOSIS — R519 Headache, unspecified: Secondary | ICD-10-CM | POA: Diagnosis not present

## 2020-04-10 DIAGNOSIS — S0990XA Unspecified injury of head, initial encounter: Secondary | ICD-10-CM | POA: Diagnosis not present

## 2020-04-10 DIAGNOSIS — S3993XA Unspecified injury of pelvis, initial encounter: Secondary | ICD-10-CM | POA: Diagnosis not present

## 2020-04-10 DIAGNOSIS — S199XXA Unspecified injury of neck, initial encounter: Secondary | ICD-10-CM | POA: Diagnosis not present

## 2020-04-10 DIAGNOSIS — S299XXA Unspecified injury of thorax, initial encounter: Secondary | ICD-10-CM | POA: Diagnosis not present

## 2020-04-10 DIAGNOSIS — S3992XA Unspecified injury of lower back, initial encounter: Secondary | ICD-10-CM | POA: Diagnosis not present

## 2020-04-10 DIAGNOSIS — S3991XA Unspecified injury of abdomen, initial encounter: Secondary | ICD-10-CM | POA: Diagnosis not present

## 2020-04-10 DIAGNOSIS — Y92410 Unspecified street and highway as the place of occurrence of the external cause: Secondary | ICD-10-CM | POA: Diagnosis not present

## 2020-04-14 ENCOUNTER — Ambulatory Visit: Payer: Self-pay | Admitting: Nurse Practitioner

## 2020-04-14 ENCOUNTER — Other Ambulatory Visit: Payer: Self-pay

## 2020-04-14 DIAGNOSIS — S00462A Insect bite (nonvenomous) of left ear, initial encounter: Secondary | ICD-10-CM

## 2020-04-14 NOTE — Progress Notes (Signed)
Here for work injury forms completed and emailed per [employer]. Instructed to RTC after 48 hours or RTC if further release is needed

## 2020-12-19 ENCOUNTER — Other Ambulatory Visit: Payer: Self-pay | Admitting: Internal Medicine

## 2021-03-07 ENCOUNTER — Other Ambulatory Visit: Payer: Self-pay

## 2021-03-07 ENCOUNTER — Ambulatory Visit: Payer: BC Managed Care – PPO | Admitting: Medical

## 2021-03-07 VITALS — BP 110/76 | HR 105 | Temp 98.9°F | Resp 16 | Wt 175.0 lb

## 2021-03-07 DIAGNOSIS — Z20822 Contact with and (suspected) exposure to covid-19: Secondary | ICD-10-CM

## 2021-03-07 DIAGNOSIS — R0602 Shortness of breath: Secondary | ICD-10-CM

## 2021-03-07 DIAGNOSIS — H6983 Other specified disorders of Eustachian tube, bilateral: Secondary | ICD-10-CM

## 2021-03-07 LAB — POC COVID19 BINAXNOW: SARS Coronavirus 2 Ag: NEGATIVE

## 2021-03-07 MED ORDER — PREDNISONE 10 MG (21) PO TBPK: ORAL_TABLET | ORAL | 0 refills | Status: DC

## 2021-03-07 MED ORDER — ALBUTEROL SULFATE HFA 108 (90 BASE) MCG/ACT IN AERS: 2.0000 | INHALATION_SPRAY | Freq: Four times a day (QID) | RESPIRATORY_TRACT | 2 refills | Status: DC | PRN

## 2021-03-07 NOTE — Patient Instructions (Signed)
Allergies, Adult An allergy means that your body reacts to something that bothers it (allergen). This can happen from something that you eat, breathe in, or touch. Allergies often affect the nose, eyes, skin, and stomach. They can be mild, moderate, or very bad (severe). An allergy cannot spread from person to person. They can happen at any age.Sometimes, people outgrow them. What are the causes? Outdoor things, such as pollen, car fumes, and mold. Indoor things, such as dust, smoke, mold, and pets. Foods. Medicines. Things that bother your skin, such as perfume and bug bites. What increases the risk? Having family members with allergies or asthma. What are the signs or symptoms? Symptoms depend on how bad your allergy is. Mild to moderate symptoms Runny nose, stuffy nose, or sneezing. Itchy mouth, ears, or throat. A feeling of mucus dripping down the back of your throat. Sore throat. Eyes that are itchy, red, watery, or puffy. A skin rash, or red, swollen areas of skin (hives). Stomach cramps or bloating. Severe symptoms Very bad allergies to food, medicine, or bug bites may cause a very bad allergy reaction (anaphylaxis). This can be life-threatening. Symptoms include: A red face. Wheezing or coughing. Swollen lips, tongue, or mouth. Tight or swollen throat. Chest pain or tightness, or a fast heartbeat. Trouble breathing or shortness of breath. Pain in your belly (abdomen), vomiting, or watery poop (diarrhea). Feeling dizzy or fainting. How is this treated?     Treatment for this condition depends on your symptoms. Treatment may include: Cold, wet cloths for itching and swelling. Eye drops, nose sprays, or skin creams. Washing out your nose each day. A humidifier. Medicines. A change to the foods you eat. Being exposed again and again to tiny amounts of allergens. This helps your body get used to them. You might have: Allergy shots. Very small amounts of allergen put  under your tongue. An emergency shot (auto-injector pen) if you have a very bad allergy reaction. This is a medicine with a needle. You can put it into your skin by yourself. Your doctor will teach you how to use it. Follow these instructions at home: Medicines  Take or apply over-the-counter and prescription medicines only as told by your doctor. If you are at risk for a very bad allergy reaction, keep an auto-injector pen with you all the time.  Eating and drinking Follow instructions from your doctor about what to eat and drink. Drink enough fluid to keep your pee (urine) pale yellow. General instructions If you have ever had a very bad allergy reaction, wear a medical alert bracelet or necklace. Stay away from things that you are allergic to. Keep all follow-up visits as told by your doctor. This is important. Contact a doctor if: Your symptoms do not get better with treatment. Get help right away if: You have symptoms of a very bad allergy reaction. These include: A swollen mouth, tongue, or throat. Pain or tightness in your chest. Trouble breathing. Being short of breath. Dizziness. Fainting. Very bad pain in your belly. Vomiting. Watery poop. These symptoms may be an emergency. Do not wait to see if the symptoms will go away. Get medical help right away. Call your local emergency services (911 in the [place] Do not drive yourself to the hospital. Summary Take or apply over-the-counter and prescription medicines only as told by your doctor. Stay away from things you are allergic to. If you are at risk for a very bad allergy reaction, carry an auto-injector pen all the time.  Wear a medical alert bracelet or necklace. Very bad allergy reactions can be life-threatening. Get help right away. This information is not intended to replace advice given to you by your health care provider. Make sure you discuss any questions you have with your healthcare provider. Document Revised:  07/15/2019 Document Reviewed: 07/15/2019 Elsevier Patient Education  2022 Elsevier Inc.  Laryngitis Laryngitis is irritation and swelling (inflammation) of your vocal cords. This condition causes symptoms such as: A change in your voice. It may sound low and hoarse. Loss of voice. Coughing. Sore throat. Dry throat. Stuffy nose. Depending on the cause, this condition may go away after a short time or may last for more than 3 weeks. Treatment often involves resting your voice andusing medicines to soothe your throat. Follow these instructions at home: Medicines Take over-the-counter and prescription medicines only as told by your doctor. If you were prescribed an antibiotic medicine, take it as told by your doctor. Do not stop taking it even if you start to feel better. General instructions Talk as little as possible. Also avoid whispering. Write instead of talking. Do this until your voice is back to normal. Drink enough fluid to keep your pee (urine) pale yellow. Breathe in moist air. Use a humidifier if you live in a dry climate. Do not use any products that have nicotine or tobacco in them, such as cigarettes and e-cigarettes. If you need help quitting, ask your doctor. Contact a doctor if: You have a fever. Your pain is worse. Your symptoms do not get better in 2 weeks. Get help right away if: You cough up blood. You have trouble swallowing. You have trouble breathing. Summary Laryngitis is inflammation of your vocal cords. This condition causes your voice to sound low and hoarse. Rest your voice by talking as little as possible. Also avoid whispering. Get help right away if you have difficulty swallowing or breathing or if you cough up blood. This information is not intended to replace advice given to you by your health care provider. Make sure you discuss any questions you have with your healthcare provider. Document Revised: 08/05/2020 Document Reviewed:  08/05/2020 Elsevier Patient Education  2022 Elsevier Inc. Eustachian Tube Dysfunction  Eustachian tube dysfunction refers to a condition in which a blockage develops in the narrow passage that connects the middle ear to the back of the nose (eustachian tube). The eustachian tube regulates air pressure in the middle ear by letting air move between the ear and nose. It also helps to drain fluid from the middle earspace. Eustachian tube dysfunction can affect one or both ears. When the eustachian tube does not function properly, air pressure, fluid, or both can build up inthe middle ear. What are the causes? This condition occurs when the eustachian tube becomes blocked or cannot open normally. Common causes of this condition include: Ear infections. Colds and other infections that affect the nose, mouth, and throat (upper respiratory tract). Allergies. Irritation from cigarette smoke. Irritation from stomach acid coming up into the esophagus (gastroesophageal reflux). The esophagus is the tube that carries food from the mouth to the stomach. Sudden changes in air pressure, such as from descending in an airplane or scuba diving. Abnormal growths in the nose or throat, such as: Growths that line the nose (nasal polyps). Abnormal growth of cells (tumors). Enlarged tissue at the back of the throat (adenoids). What increases the risk? You are more likely to develop this condition if: You smoke. You are overweight. You are a child  who has: Certain birth defects of the mouth, such as cleft palate. Large tonsils or adenoids. What are the signs or symptoms? Common symptoms of this condition include: A feeling of fullness in the ear. Ear pain. Clicking or popping noises in the ear. Ringing in the ear. Hearing loss. Loss of balance. Dizziness. Symptoms may get worse when the air pressure around you changes, such as whenyou travel to an area of high elevation, fly on an airplane, or go scuba  diving. How is this diagnosed? This condition may be diagnosed based on: Your symptoms. A physical exam of your ears, nose, and throat. Tests, such as those that measure: The movement of your eardrum (tympanogram). Your hearing (audiometry). How is this treated? Treatment depends on the cause and severity of your condition. In mild cases, you may relieve your symptoms by moving air into your ears. This is called "popping the ears." In more severe cases, or if you have symptoms of fluid in your ears, treatment may include: Medicines to relieve congestion (decongestants). Medicines that treat allergies (antihistamines). Nasal sprays or ear drops that contain medicines that reduce swelling (steroids). A procedure to drain the fluid in your eardrum (myringotomy). In this procedure, a small tube is placed in the eardrum to: Drain the fluid. Restore the air in the middle ear space. A procedure to insert a balloon device through the nose to inflate the opening of the eustachian tube (balloon dilation). Follow these instructions at home: Lifestyle Do not do any of the following until your health care provider approves: Travel to high altitudes. Fly in airplanes. Work in a Estate agent or room. Scuba dive. Do not use any products that contain nicotine or tobacco, such as cigarettes and e-cigarettes. If you need help quitting, ask your health care provider. Keep your ears dry. Wear fitted earplugs during showering and bathing. Dry your ears completely after. General instructions Take over-the-counter and prescription medicines only as told by your health care provider. Use techniques to help pop your ears as recommended by your health care provider. These may include: Chewing gum. Yawning. Frequent, forceful swallowing. Closing your mouth, holding your nose closed, and gently blowing as if you are trying to blow air out of your nose. Keep all follow-up visits as told by your health care  provider. This is important. Contact a health care provider if: Your symptoms do not go away after treatment. Your symptoms come back after treatment. You are unable to pop your ears. You have: A fever. Pain in your ear. Pain in your head or neck. Fluid draining from your ear. Your hearing suddenly changes. You become very dizzy. You lose your balance. Summary Eustachian tube dysfunction refers to a condition in which a blockage develops in the eustachian tube. It can be caused by ear infections, allergies, inhaled irritants, or abnormal growths in the nose or throat. Symptoms include ear pain, hearing loss, or ringing in the ears. Mild cases are treated with maneuvers to unblock the ears, such as yawning or ear popping. Severe cases are treated with medicines. Surgery may also be done (rare). This information is not intended to replace advice given to you by your health care provider. Make sure you discuss any questions you have with your healthcare provider. Document Revised: 12/24/2017 Document Reviewed: 12/24/2017 Elsevier Patient Education  2022 [employer].

## 2021-03-14 ENCOUNTER — Other Ambulatory Visit: Payer: Self-pay

## 2021-03-14 ENCOUNTER — Ambulatory Visit: Payer: BC Managed Care – PPO | Admitting: Nurse Practitioner

## 2021-03-14 VITALS — BP 139/84 | HR 78 | Temp 98.3°F | Resp 16 | Wt 175.0 lb

## 2021-03-14 DIAGNOSIS — J40 Bronchitis, not specified as acute or chronic: Secondary | ICD-10-CM

## 2021-03-14 MED ORDER — AZITHROMYCIN 250 MG PO TABS: ORAL_TABLET | ORAL | 0 refills | Status: AC

## 2021-03-14 NOTE — Progress Notes (Deleted)
   Subjective:    Patient ID: Caroline Gonzalez, female    DOB: August 01, 1996, 25 y.o.   MRN: [id number]  HPI    Review of Systems     Objective:   Physical Exam        Assessment & Plan:

## 2021-03-14 NOTE — Progress Notes (Signed)
[facility]  [address]  [place], [place] [address] Phone [phone] Fax  [phone]  Worker's Compensation Report Form   Caroline Gonzalez Date of Birth: [phone] Phone Number:[phone] Email: [email or web address] Department: [place] [place]  Job Title:grounds Arboriculturist Notified:Yes  Date of Injury:03/06/2021 Time of Injury:0800 Shift Worked:1st Location where injury occurred (address or landmark):[employer]   Body Part Injured: Lungs/ Inhaled injury    Vital Signs BP 139/84   Pulse 78   Temp 98.3 F (36.8 C) (Oral)   Resp 16   Wt 175 lb (79.4 kg)   SpO2 99%   BMI 27.14 kg/m   Injury Description  See previous note, employee was spraying [place] [employer] and started to experience throat swelling, itchy eyes and shortness of breath.    Provider Note Patient is returning to [facility] today for follow up regarding previous injury. She has finished her Prednisone, and has continued to use an Albuterol Inhaler as needed for coughing and shortness of breath. She gets winded with work still, and has developed a productive cough and sinus congestion over the past week. Exam: clear lung sounds, productive course cough, sinus drainage and inflamed turbinates bilaterally. No acute distress.    Diagnosis  1. Bronchitis Advised continuing to use albuterol as needed, continue Mucinex over the coutner as directed by package and will add antibiotic for management of productive cough. Return to clinic in 48 hours for evaluation, see work restrictions in return to work note. Return to clinic earlier with any worsening symptoms.       Medications Prescribed  Meds ordered this encounter  Medications   azithromycin (ZITHROMAX) 250 MG tablet    Sig: Take 2 tablets on day 1, then 1 tablet daily on days 2 through 5    Dispense:  6 tablet    Refill:  0    Referred to N/A    Return to  Work Status Restrictions in Work note.    Provider Signature ________________________________________Date_________   Employee Signature _______________________________________Date_________   Please email this completed form to Angus Seller, Director of Risk Management at [email or web address] within 24 hours of visit.

## 2021-03-16 ENCOUNTER — Ambulatory Visit
Admission: RE | Admit: 2021-03-16 | Discharge: 2021-03-16 | Disposition: A | Discharge status: Home / Self Care | Payer: No Typology Code available for payment source | Attending: Nurse Practitioner | Admitting: Nurse Practitioner

## 2021-03-16 ENCOUNTER — Other Ambulatory Visit: Payer: Self-pay | Admitting: Nurse Practitioner

## 2021-03-16 ENCOUNTER — Encounter: Payer: Self-pay | Admitting: Nurse Practitioner

## 2021-03-16 ENCOUNTER — Other Ambulatory Visit: Payer: Self-pay

## 2021-03-16 ENCOUNTER — Ambulatory Visit
Admission: RE | Admit: 2021-03-16 | Discharge: 2021-03-16 | Disposition: A | Discharge status: Home / Self Care | Payer: No Typology Code available for payment source | Source: Ambulatory Visit | Attending: Nurse Practitioner | Admitting: Nurse Practitioner

## 2021-03-16 ENCOUNTER — Ambulatory Visit: Payer: BC Managed Care – PPO | Admitting: Nurse Practitioner

## 2021-03-16 VITALS — BP 122/78 | HR 89 | Temp 98.4°F | Resp 16

## 2021-03-16 DIAGNOSIS — R059 Cough, unspecified: Secondary | ICD-10-CM | POA: Insufficient documentation

## 2021-03-16 DIAGNOSIS — R0602 Shortness of breath: Secondary | ICD-10-CM

## 2021-03-16 NOTE — Progress Notes (Signed)
[facility]  [address]  [place], [place] [address] Phone [phone] Fax  [phone]  Worker's Compensation Report Form   Ileana Chalupa Date of [phone] Phone [phone]   Department:[employer] Job Title:grounds Biomedical engineer Supervisor:Sal C Supervisor Notified:Yes    Body Part Injured:Respiratory tract   Vital Signs BP 122/78 (BP Location: Left Arm, Patient Position: Sitting, Cuff Size: Normal)   Pulse 89   Temp 98.4 F (36.9 C) (Tympanic)   Resp 16   SpO2 96%   Injury Description  See previous note- follow up on incident from 03/06/2021   Provider Note Patient is improving today, cough has loosened today is 3rd day on antibiotic. She has finished her prednisone, and continues to use inhaler 3-4 times daily for cough and wheezing. Continues to have sinus congestion and has been using Flonase, allegra and Mucinex for relief. She is back to work.    Diagnosis  Cough  Nasal Congestion   Medications Prescribed  Continue Azithromycin antibiotic as prescribed Continue Albuterol Inhaler as needed    Referred to: N/A   Tests ordered: Chest X-Ray Performed 03/16/2021 at [facility]  X-Ray Results: EXAM: CHEST - 2 VIEW   COMPARISON:  None.   FINDINGS: The heart size and mediastinal contours are within normal limits. Both lungs are clear. The visualized skeletal structures are unremarkable.   IMPRESSION: No active cardiopulmonary disease.     Electronically Signed   By: Danae Orleans M.D.   On: 03/17/2021 10:15  Return to Work Status May return to work today after Chest XRAY is completed. May return to work without restrictions.    Provider Signature ________________________________________Date_________   Employee Signature _______________________________________Date_________   Please email this completed form to Angus Seller, Director of Risk Management at [email or web address] within 24 hours of  visit.

## 2021-03-17 ENCOUNTER — Other Ambulatory Visit: Payer: Self-pay | Admitting: Internal Medicine

## 2021-03-17 ENCOUNTER — Telehealth: Payer: Self-pay | Admitting: Nurse Practitioner

## 2021-03-17 NOTE — Telephone Encounter (Signed)
Called patient with XRAY results- below   XRAY chest WNL.  Patient states she woke up today feeling much better. Now 48 hours on antibiotic.   Patient will return to clinic as needed with any ongoing symptoms or concerns.   EXAM: CHEST - 2 VIEW   COMPARISON:  None.   FINDINGS: The heart size and mediastinal contours are within normal limits. Both lungs are clear. The visualized skeletal structures are unremarkable.   IMPRESSION: No active cardiopulmonary disease.     Electronically Signed   By: Danae Orleans M.D.   On: 03/17/2021 10:15

## 2021-03-17 NOTE — Telephone Encounter (Signed)
  Notes to clinic Not a patient in this practice.  

## 2021-03-21 ENCOUNTER — Encounter: Payer: Self-pay | Admitting: Internal Medicine

## 2021-03-21 ENCOUNTER — Ambulatory Visit (INDEPENDENT_AMBULATORY_CARE_PROVIDER_SITE_OTHER): Payer: BLUE CROSS/BLUE SHIELD | Admitting: Internal Medicine

## 2021-03-21 ENCOUNTER — Other Ambulatory Visit: Payer: Self-pay

## 2021-03-21 VITALS — BP 118/73 | HR 84 | Temp 97.3°F | Resp 16 | Ht 65.75 in | Wt 172.0 lb

## 2021-03-21 DIAGNOSIS — F419 Anxiety disorder, unspecified: Secondary | ICD-10-CM | POA: Diagnosis not present

## 2021-03-21 DIAGNOSIS — Z1159 Encounter for screening for other viral diseases: Secondary | ICD-10-CM

## 2021-03-21 DIAGNOSIS — Z0001 Encounter for general adult medical examination with abnormal findings: Secondary | ICD-10-CM

## 2021-03-21 DIAGNOSIS — E7800 Pure hypercholesterolemia, unspecified: Secondary | ICD-10-CM

## 2021-03-21 DIAGNOSIS — Z6827 Body mass index (BMI) 27.0-27.9, adult: Secondary | ICD-10-CM

## 2021-03-21 DIAGNOSIS — Z114 Encounter for screening for human immunodeficiency virus [HIV]: Secondary | ICD-10-CM | POA: Diagnosis not present

## 2021-03-21 DIAGNOSIS — E663 Overweight: Secondary | ICD-10-CM | POA: Insufficient documentation

## 2021-03-21 DIAGNOSIS — F32A Depression, unspecified: Secondary | ICD-10-CM

## 2021-03-21 MED ORDER — ESCITALOPRAM OXALATE 20 MG PO TABS: 20.0000 mg | ORAL_TABLET | Freq: Every day | ORAL | 3 refills | Status: DC

## 2021-03-21 MED ORDER — DESOGESTREL-ETHINYL ESTRADIOL 0.15-0.02/0.01 MG (21/5) PO TABS: 1.0000 | ORAL_TABLET | Freq: Every day | ORAL | 3 refills | Status: DC

## 2021-03-21 NOTE — Assessment & Plan Note (Signed)
Stable on Escitalopram, refilled today Support offered 

## 2021-03-21 NOTE — Patient Instructions (Signed)
Health Maintenance, Female Adopting a healthy lifestyle and getting preventive care are important in promoting health and wellness. Ask your health care provider about: The right schedule for you to have regular tests and exams. Things you can do on your own to prevent diseases and keep yourself healthy. What should I know about diet, weight, and exercise? Eat a healthy diet  Eat a diet that includes plenty of vegetables, fruits, low-fat dairy products, and lean protein. Do not eat a lot of foods that are high in solid fats, added sugars, or sodium.  Maintain a healthy weight Body mass index (BMI) is used to identify weight problems. It estimates body fat based on height and weight. Your health care provider can help determineyour BMI and help you achieve or maintain a healthy weight. Get regular exercise Get regular exercise. This is one of the most important things you can do for your health. Most adults should: Exercise for at least 150 minutes each week. The exercise should increase your heart rate and make you sweat (moderate-intensity exercise). Do strengthening exercises at least twice a week. This is in addition to the moderate-intensity exercise. Spend less time sitting. Even light physical activity can be beneficial. Watch cholesterol and blood lipids Have your blood tested for lipids and cholesterol at 26 years of age, then havethis test every 5 years. Have your cholesterol levels checked more often if: Your lipid or cholesterol levels are high. You are older than 26 years of age. You are at high risk for heart disease. What should I know about cancer screening? Depending on your health history and family history, you may need to have cancer screening at various ages. This may include screening for: Breast cancer. Cervical cancer. Colorectal cancer. Skin cancer. Lung cancer. What should I know about heart disease, diabetes, and high blood pressure? Blood pressure and heart  disease High blood pressure causes heart disease and increases the risk of stroke. This is more likely to develop in people who have high blood pressure readings, are of African descent, or are overweight. Have your blood pressure checked: Every 3-5 years if you are 18-39 years of age. Every year if you are 40 years old or older. Diabetes Have regular diabetes screenings. This checks your fasting blood sugar level. Have the screening done: Once every three years after age 40 if you are at a normal weight and have a low risk for diabetes. More often and at a younger age if you are overweight or have a high risk for diabetes. What should I know about preventing infection? Hepatitis B If you have a higher risk for hepatitis B, you should be screened for this virus. Talk with your health care provider to find out if you are at risk forhepatitis B infection. Hepatitis C Testing is recommended for: Everyone born from 1945 through 1965. Anyone with known risk factors for hepatitis C. Sexually transmitted infections (STIs) Get screened for STIs, including gonorrhea and chlamydia, if: You are sexually active and are younger than 26 years of age. You are older than 26 years of age and your health care provider tells you that you are at risk for this type of infection. Your sexual activity has changed since you were last screened, and you are at increased risk for chlamydia or gonorrhea. Ask your health care provider if you are at risk. Ask your health care provider about whether you are at high risk for HIV. Your health care provider may recommend a prescription medicine to help   prevent HIV infection. If you choose to take medicine to prevent HIV, you should first get tested for HIV. You should then be tested every 3 months for as long as you are taking the medicine. Pregnancy If you are about to stop having your period (premenopausal) and you may become pregnant, seek counseling before you get  pregnant. Take 400 to 800 micrograms (mcg) of folic acid every day if you become pregnant. Ask for birth control (contraception) if you want to prevent pregnancy. Osteoporosis and menopause Osteoporosis is a disease in which the bones lose minerals and strength with aging. This can result in bone fractures. If you are 65 years old or older, or if you are at risk for osteoporosis and fractures, ask your health care provider if you should: Be screened for bone loss. Take a calcium or vitamin D supplement to lower your risk of fractures. Be given hormone replacement therapy (HRT) to treat symptoms of menopause. Follow these instructions at home: Lifestyle Do not use any products that contain nicotine or tobacco, such as cigarettes, e-cigarettes, and chewing tobacco. If you need help quitting, ask your health care provider. Do not use street drugs. Do not share needles. Ask your health care provider for help if you need support or information about quitting drugs. Alcohol use Do not drink alcohol if: Your health care provider tells you not to drink. You are pregnant, may be pregnant, or are planning to become pregnant. If you drink alcohol: Limit how much you use to 0-1 drink a day. Limit intake if you are breastfeeding. Be aware of how much alcohol is in your drink. In the [place] one drink equals one 12 oz bottle of beer (355 mL), one 5 oz glass of wine (148 mL), or one 1 oz glass of hard liquor (44 mL). General instructions Schedule regular health, dental, and eye exams. Stay current with your vaccines. Tell your health care provider if: You often feel depressed. You have ever been abused or do not feel safe at home. Summary Adopting a healthy lifestyle and getting preventive care are important in promoting health and wellness. Follow your health care provider's instructions about healthy diet, exercising, and getting tested or screened for diseases. Follow your health care provider's  instructions on monitoring your cholesterol and blood pressure. This information is not intended to replace advice given to you by your health care provider. Make sure you discuss any questions you have with your healthcare provider. Document Revised: 08/27/2018 Document Reviewed: 08/27/2018 Elsevier Patient Education  2022 Elsevier Inc.  

## 2021-03-21 NOTE — Assessment & Plan Note (Signed)
Encouraged diet and exercise for weight loss ?

## 2021-03-21 NOTE — Progress Notes (Signed)
Subjective:    Patient ID: Caroline Gonzalez, female    DOB: November 26, 1995, 26 y.o.   MRN: [id number]  HPI  Pt presents to the clinic today for her annual exam.  Anxiety and Depression: Chronic, managed on Escitalopram. She is not currently seeing a therapist. She denies SI/HI.  Flu: 07/2018 Tetanus: 11/2019 Covid: never Pap Smear: 11/2019 Dentist: biannually  Diet: She does eat meat. She consumes fruits and veggies. She does eat fried foods. She drinks mostly water. Exercise: Weight lift  Review of Systems  Past Medical History:  Diagnosis Date   Anxiety    Contraception    Depression    Sleep disturbance     Current Outpatient Medications  Medication Sig Dispense Refill   albuterol (VENTOLIN HFA) 108 (90 Base) MCG/ACT inhaler Inhale 2 puffs into the lungs every 6 (six) hours as needed for wheezing or shortness of breath. 8 g 2   desogestrel-ethinyl estradiol (AZURETTE) 0.15-0.02/0.01 MG (21/5) tablet Take 1 tablet by mouth daily. MUST SCHEDULE PHYSICAL 84 tablet 0   escitalopram (LEXAPRO) 20 MG tablet Take 1 tablet by mouth once daily 90 tablet 3   No current facility-administered medications for this visit.    Allergies  Allergen Reactions   Codeine     REACTION: hallucinations   Morphine     REACTION: hallucinations   Shrimp [Shellfish Allergy] Other (See Comments)    Vomiting, fever   Sulfa Antibiotics    Tamiflu [Oseltamivir Phosphate]     Family History  Problem Relation Age of Onset   Diabetes Paternal Grandfather    Breast cancer Paternal Grandmother    Breast cancer Paternal Aunt    Hypertension Mother    Hearing loss Maternal Aunt    Hearing loss Maternal Uncle    Heart disease Maternal Grandmother    Heart disease Maternal Grandfather     Social History   Socioeconomic History   Marital status: Single    Spouse name: Not on file   Number of children: Not on file   Years of education: Not on file   Highest education level: Not on file   Occupational History   Not on file  Tobacco Use   Smoking status: Never   Smokeless tobacco: Never  Vaping Use   Vaping Use: Never used  Substance and Sexual Activity   Alcohol use: Yes    Alcohol/week: 0.0 standard drinks    Comment: social   Drug use: No    Comment: THC--recently quit   Sexual activity: Yes  Other Topics Concern   Not on file  Social History Narrative   Not on file   Social Determinants of Health   Financial Resource Strain: Not on file  Food Insecurity: Not on file  Transportation Needs: Not on file  Physical Activity: Not on file  Stress: Not on file  Social Connections: Not on file  Intimate Partner Violence: Not on file     Constitutional: Denies fever, malaise, fatigue, headache or abrupt weight changes.  HEENT: Denies eye pain, eye redness, ear pain, ringing in the ears, wax buildup, runny nose, nasal congestion, bloody nose, or sore throat. Respiratory: Denies difficulty breathing, shortness of breath, cough or sputum production.   Cardiovascular: Denies chest pain, chest tightness, palpitations or swelling in the hands or feet.  Gastrointestinal: Denies abdominal pain, bloating, constipation, diarrhea or blood in the stool.  GU: Denies urgency, frequency, pain with urination, burning sensation, blood in urine, odor or discharge. Musculoskeletal: Denies decrease in range  of motion, difficulty with gait, muscle pain or joint pain and swelling.  Skin: Denies redness, rashes, lesions or ulcercations.  Neurological: Denies dizziness, difficulty with memory, difficulty with speech or problems with balance and coordination.  Psych: Pt reports anxiety and depression. Denies SI/HI.  No other specific complaints in a complete review of systems (except as listed in HPI above).     Objective:   Physical Exam   BP 118/73   Pulse 84   Temp (!) 97.3 F (36.3 C) (Temporal)   Resp 16   Ht 5' 5.75" (1.67 m)   Wt 172 lb (78 kg)   LMP 03/05/2021    SpO2 95%   BMI 27.97 kg/m   Wt Readings from Last 3 Encounters:  03/14/21 175 lb (79.4 kg)  03/07/21 175 lb (79.4 kg)  11/23/19 153 lb (69.4 kg)    General: Appears their stated age, overweight, in NAD. Skin: Warm, dry and intact. No rashes, lesions or ulcerations noted. HEENT: Head: normal shape and size; Eyes: sclera white, PERRLA and EOMs intact;  Neck:  Neck supple, trachea midline. No masses, lumps or thyromegaly present.  Cardiovascular: Normal rate and rhythm. S1,S2 noted.  No murmur, rubs or gallops noted. No JVD or BLE edema.  Pulmonary/Chest: Normal effort and positive vesicular breath sounds. No respiratory distress. No wheezes, rales or ronchi noted.  Abdomen: Soft and nontender. Normal bowel sounds. No distention or masses noted. Liver, spleen and kidneys non palpable. Musculoskeletal: Strength 5/5 BUE/BLE. No difficulty with gait.  Neurological: Alert and oriented. Cranial nerves II-XII grossly intact. Coordination normal.  Psychiatric: Mood and affect normal. Behavior is normal. Judgment and thought content normal.    BMET    Component Value Date/Time   NA 140 11/23/2019 1613   NA 144 07/19/2017 1704   NA 140 11/30/2012 2156   K 4.5 11/23/2019 1613   K 3.5 11/30/2012 2156   CL 104 11/23/2019 1613   CL 106 11/30/2012 2156   CO2 30 11/23/2019 1613   CO2 28 (H) 11/30/2012 2156   GLUCOSE 97 11/23/2019 1613   GLUCOSE 85 11/30/2012 2156   BUN 16 11/23/2019 1613   BUN 13 07/19/2017 1704   BUN 7 (L) 11/30/2012 2156   CREATININE 0.96 11/23/2019 1613   CREATININE 0.61 11/30/2012 2156   CALCIUM 10.1 11/23/2019 1613   CALCIUM 9.1 11/30/2012 2156   GFRNONAA 101 07/19/2017 1704   GFRAA 117 07/19/2017 1704    Lipid Panel     Component Value Date/Time   CHOL 240 (H) 11/23/2019 1613   TRIG 108.0 11/23/2019 1613   HDL 75.10 11/23/2019 1613   CHOLHDL 3 11/23/2019 1613   VLDL 21.6 11/23/2019 1613   LDLCALC 144 (H) 11/23/2019 1613    CBC    Component Value  Date/Time   WBC 10.6 (H) 11/23/2019 1613   RBC 4.75 11/23/2019 1613   HGB 14.2 11/23/2019 1613   HGB 13.4 07/19/2017 1704   HCT 41.5 11/23/2019 1613   HCT 40.3 07/19/2017 1704   PLT 323.0 11/23/2019 1613   PLT 326 07/19/2017 1704   MCV 87.4 11/23/2019 1613   MCV 86 07/19/2017 1704   MCV 88 09/06/2014 0938   MCH 28.7 07/19/2017 1704   MCH 28.8 09/06/2014 0938   MCHC 34.1 11/23/2019 1613   RDW 12.6 11/23/2019 1613   RDW 13.1 07/19/2017 1704   RDW 13.1 09/06/2014 0938   LYMPHSABS 2.3 09/06/2014 0938   MONOABS 1.5 (H) 09/06/2014 0938   EOSABS 0.1 09/06/2014 5625  BASOSABS 0.1 09/06/2014 0938    Hgb A1C No results found for: HGBA1C         Assessment & Plan:    Preventative Health Maintenance:  Encouraged her to get a flu shot in the fall Tetanus UTD Encouraged her to get a covid vaccine Pap smear UTD Encouraged her to consume a balanced diet and exercise regimen Advised her to see an eye doctor and dentist annually Will check CBC, CMET, TSH, Lipid, A1C, HIV and Hep C today  RTC in 1 year, sooner if needed Nicki Reaper, NP This visit occurred during the SARS-CoV-2 public health emergency.  Safety protocols were in place, including screening questions prior to the visit, additional usage of staff PPE, and extensive cleaning of exam room while observing appropriate contact time as indicated for disinfecting solutions.

## 2021-03-21 NOTE — Telephone Encounter (Signed)
Last office visit 01/14/2020 for seasonal allergic rhinitis with R. Baity.  Last refilled 12/21/2020 for #84 with no refills.  No TOC appointments.

## 2021-03-27 ENCOUNTER — Other Ambulatory Visit: Payer: Self-pay

## 2021-03-27 DIAGNOSIS — Z1159 Encounter for screening for other viral diseases: Secondary | ICD-10-CM | POA: Diagnosis not present

## 2021-03-27 DIAGNOSIS — Z114 Encounter for screening for human immunodeficiency virus [HIV]: Secondary | ICD-10-CM | POA: Diagnosis not present

## 2021-03-27 DIAGNOSIS — Z0001 Encounter for general adult medical examination with abnormal findings: Secondary | ICD-10-CM | POA: Diagnosis not present

## 2021-03-28 LAB — COMPLETE METABOLIC PANEL WITH GFR
AG Ratio: 1.8 (calc) (ref 1.0–2.5)
ALT: 12 U/L (ref 6–29)
AST: 14 U/L (ref 10–30)
Albumin: 4.6 g/dL (ref 3.6–5.1)
Alkaline phosphatase (APISO): 50 U/L (ref 31–125)
BUN: 10 mg/dL (ref 7–25)
CO2: 25 mmol/L (ref 20–32)
Calcium: 9.8 mg/dL (ref 8.6–10.2)
Chloride: 102 mmol/L (ref 98–110)
Creat: 0.77 mg/dL (ref 0.50–0.96)
Globulin: 2.5 g/dL (calc) (ref 1.9–3.7)
Glucose, Bld: 89 mg/dL (ref 65–99)
Potassium: 4.3 mmol/L (ref 3.5–5.3)
Sodium: 136 mmol/L (ref 135–146)
Total Bilirubin: 0.4 mg/dL (ref 0.2–1.2)
Total Protein: 7.1 g/dL (ref 6.1–8.1)
eGFR: 110 mL/min/{1.73_m2} (ref >=60)

## 2021-03-28 LAB — LIPID PANEL
Cholesterol: 235 mg/dL — ABNORMAL HIGH (ref <200)
HDL: 70 mg/dL (ref >=50)
LDL Cholesterol (Calc): 142 mg/dL (calc) — ABNORMAL HIGH
Non-HDL Cholesterol (Calc): 165 mg/dL (calc) — ABNORMAL HIGH (ref <130)
Total CHOL/HDL Ratio: 3.4 (calc) (ref <5.0)
Triglycerides: 112 mg/dL (ref <150)

## 2021-03-28 LAB — CBC
HCT: 42.2 % (ref 35.0–45.0)
Hemoglobin: 14.1 g/dL (ref 11.7–15.5)
MCH: 28.8 pg (ref 27.0–33.0)
MCHC: 33.4 g/dL (ref 32.0–36.0)
MCV: 86.3 fL (ref 80.0–100.0)
MPV: 9.3 fL (ref 7.5–12.5)
Platelets: 339 10*3/uL (ref 140–400)
RBC: 4.89 10*6/uL (ref 3.80–5.10)
RDW: 12.1 % (ref 11.0–15.0)
WBC: 6.1 10*3/uL (ref 3.8–10.8)

## 2021-03-28 LAB — HEMOGLOBIN A1C
Hgb A1c MFr Bld: 5 % of total Hgb (ref <5.7)
Mean Plasma Glucose: 97 mg/dL
eAG (mmol/L): 5.4 mmol/L

## 2021-03-28 LAB — HEPATITIS C ANTIBODY
Hepatitis C Ab: NONREACTIVE
SIGNAL TO CUT-OFF: 0.01 (ref <1.00)

## 2021-03-28 LAB — HIV ANTIBODY (ROUTINE TESTING W REFLEX): HIV 1&2 Ab, 4th Generation: NONREACTIVE

## 2021-03-28 LAB — TSH: TSH: 1.91 mIU/L

## 2021-03-29 NOTE — Addendum Note (Signed)
Addended by: Lorre Munroe on: 03/29/2021 04:13 PM   Modules accepted: Orders

## 2021-05-19 ENCOUNTER — Encounter: Payer: Self-pay | Admitting: Nurse Practitioner

## 2021-05-19 ENCOUNTER — Other Ambulatory Visit: Payer: Self-pay

## 2021-05-19 ENCOUNTER — Ambulatory Visit: Payer: BC Managed Care – PPO | Admitting: Nurse Practitioner

## 2021-05-19 VITALS — BP 110/74 | HR 81 | Temp 98.0°F | Resp 16 | Ht 66.5 in | Wt 165.0 lb

## 2021-05-19 DIAGNOSIS — M62830 Muscle spasm of back: Secondary | ICD-10-CM

## 2021-05-19 MED ORDER — IBUPROFEN 600 MG PO TABS: 600.0000 mg | ORAL_TABLET | Freq: Four times a day (QID) | ORAL | 0 refills | Status: AC | PRN

## 2021-05-19 MED ORDER — CYCLOBENZAPRINE HCL 10 MG PO TABS: 10.0000 mg | ORAL_TABLET | Freq: Three times a day (TID) | ORAL | 0 refills | Status: DC | PRN

## 2021-05-19 NOTE — Progress Notes (Signed)
[facility] [address]  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 She said she turned and instantly felt her back seize up in what she believes is a muscle spasm.    Provider Note 25 year old female presenting to 22 after acute back injury this morning while at work. She as lifting patio furniture and felt a tightness and spasm in her mid to upper back on both sides. She came immediately to the clinic for evaluation. Stated that it was difficult to get in and out of a car. It is currently painful to sit and she feels better standing. Pain and tightness is isolated to Latissimus dorsi regions bilaterally from lumbar up to thoracic levels. No tenderness over vertebral regions. Pain increases with lateral rotation at hips. She is able to stand on tip toes and heels. Sensation and strength to bilateral upper and lower extremities intact. Pain increases with shoulder shrug as well. Pain is isolated to muscular region, no radiation to extremities.    Diagnosis  1. Muscle spasm of back - cyclobenzaprine (FLEXERIL) 10 MG tablet; Take 1 tablet (10 mg total) by mouth 3 (three) times daily as  needed for muscle spasms. Will cause drowsiness, do not operate machinery or drive while taking  Dispense: 30 tablet; Refill: 0 - ibuprofen (ADVIL) 600 MG tablet; Take 1 tablet (600 mg total) by mouth every 6 (six) hours as needed for up to 10 days for moderate pain. Take with food  Dispense: 30 tablet; Refill: 0    May ice for up to 15 minutes every hour May use topical Icy hot for additional relief May take tylenol 1,000mg  up to every 8 hours between ibuprofen doses as needed for pain  Medications Prescribed  As above    Return appointment: 05/19/2021 Will seek medical attention over the weekend for any acutely worsening symptoms as discussed.     Return to Work Status May return 05/22/2021 limitations include no lifting no pushing, pulling or using equiptment causing vibrations.    Provider Signature ________________________________________Date_________   Employee Signature _______________________________________Date_________   Please email this completed form to 07/22/2021, Director of Risk Management at [email or web address] within 24 hours of visit.

## 2021-05-22 ENCOUNTER — Other Ambulatory Visit: Payer: Self-pay

## 2021-05-22 ENCOUNTER — Encounter: Payer: Self-pay | Admitting: Medical

## 2021-05-22 ENCOUNTER — Ambulatory Visit: Payer: BC Managed Care – PPO | Admitting: Medical

## 2021-05-22 VITALS — BP 123/73 | HR 81 | Temp 98.7°F | Resp 16 | Ht 67.0 in | Wt 176.6 lb

## 2021-05-22 DIAGNOSIS — M62830 Muscle spasm of back: Secondary | ICD-10-CM

## 2021-05-22 NOTE — Progress Notes (Signed)
Patient ID: Caroline Gonzalez, female   DOB: 04-25-1996, 25 y.o.   MRN: [id number]  [facility]  [facility] [address]  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  Injury Description , she turned while  Lifting patio furniture twisted and felt muscle spasm in mid back.   Provider Note Rates pain  3 /10 Iced back x 2 days, No radiation of pain , denies numbness or tingling No loss of bowel or bladder No weakness. Tght trapezius bilaterally,   moving  all extremities well Mild tenderness on left CVA with palpation.  Medications Prescribed    Diagnosis  Muscle Spasm of back Treated with cyclobenzaprine 10mg  and ibuprofen 600mg  Heat and ice alternate. Only take cyclobenzaprine at night, not while working, due to sedation.   No orders of the defined types were placed in this encounter.   Referred to   Return to clinic  in 7 days, sooner if any concerns.  Return to Work Status No lifting more then  10 lbs, no bending or twisting, no pushing , pulling or use of equipment that causing vibration, no mowing. Work note completed.  Provider Signature ________________________________________Date_________   Employee Signature _______________________________________Date_________   Please email  this completed form to , Director of Risk Management at [email or web address] within 24 hours of visit.

## 2021-05-22 NOTE — Progress Notes (Signed)
Open in error, a note was already started by PA.

## 2021-05-25 NOTE — Progress Notes (Signed)
25 yo female in non acute distress complains of  bilateral ear pain, hoarseness , throat swelling ( no trouble breathing in exam room), nasal pressure. She worked outside Teacher, English as a foreign language.Denies exposure to chemicals.  Blood pressure 110/76, pulse (!) 105, temperature 98.9 F (37.2 C), temperature source Tympanic, resp. rate 16, weight 175 lb (79.4 kg), last menstrual period 02/10/2021, SpO2 100 %.  Allergies  Allergen Reactions   Codeine     REACTION: hallucinations   Morphine     REACTION: hallucinations   Shrimp [Shellfish Allergy] Other (See Comments)    Vomiting, fever   Sulfa Antibiotics    Tamiflu [Oseltamivir Phosphate]    Review of Systems  HENT:  Positive for ear pain (both).        Hoarseness  Respiratory:  Positive for shortness of breath (o visably SOB).     Physical Exam Vitals and nursing note reviewed.  Constitutional:      Appearance: Normal appearance.  HENT:     Head: Normocephalic and atraumatic.     Jaw: There is normal jaw occlusion.     Right Ear: Ear canal and external ear normal. A middle ear effusion is present.     Left Ear: Ear canal and external ear normal. A middle ear effusion is present.     Mouth/Throat:     Lips: Pink.     Mouth: Mucous membranes are moist.     Pharynx: Oropharynx is clear. Uvula midline.     Tonsils: No tonsillar exudate or tonsillar abscesses. 1+ on the right. 1+ on the left.  Eyes:     Extraocular Movements: Extraocular movements intact.     Conjunctiva/sclera: Conjunctivae normal.     Pupils: Pupils are equal, round, and reactive to light.  Cardiovascular:     Rate and Rhythm: Normal rate and regular rhythm.     Heart sounds: Normal heart sounds.  Pulmonary:     Effort: Pulmonary effort is normal. No respiratory distress.     Breath sounds: Normal breath sounds. No stridor. No wheezing, rhonchi or rales.  Musculoskeletal:        General: Normal range of motion.     Cervical back: Normal range of motion and neck supple.   Lymphadenopathy:     Cervical: No cervical adenopathy.  Skin:    General: Skin is warm and dry.  Neurological:     General: No focal deficit present.     Mental Status: She is alert and oriented to person, place, and time.  Psychiatric:        Mood and Affect: Mood normal.        Behavior: Behavior normal. Behavior is cooperative.        Thought Content: Thought content normal.        Judgment: Judgment normal.  No results found for this or any previous visit (from the past 24 hour(s)).  Diagnosis Encounter for  Covid 19 screening. Seasonal allergies Eustachian tube dysfunction. Shortness of breath Non sedative anti-histamine and steroid nasal spray. Like OTC  Zyrtec,  OTC  Flonase daily per package instrlucstions. Return to the clinic as needed. Patient verbalizes understanding and has no questions at discharge.

## 2021-05-29 ENCOUNTER — Other Ambulatory Visit: Payer: Self-pay

## 2021-05-29 ENCOUNTER — Ambulatory Visit: Payer: BC Managed Care – PPO | Admitting: Medical

## 2021-05-29 ENCOUNTER — Encounter: Payer: Self-pay | Admitting: Medical

## 2021-05-29 VITALS — BP 114/82 | HR 95 | Temp 98.9°F | Resp 16 | Wt 172.0 lb

## 2021-05-29 DIAGNOSIS — M62830 Muscle spasm of back: Secondary | ICD-10-CM

## 2021-05-29 DIAGNOSIS — Z026 Encounter for examination for insurance purposes: Secondary | ICD-10-CM

## 2021-05-29 NOTE — Progress Notes (Signed)
Patient ID: Caroline Gonzalez, female   DOB: 07-30-1996, 25 y.o.   MRN: [id number]    [facility]  [facility] 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 She said she turned and instantly felt her back seize up in what she believes is a muscle spasm.    Provider Note Original injury 05/19/21. Patient states she better and is back to playing with her son starting last Thursday, ( 05/25/21)  Sometimes twisting to the left she can feel a twinge but no pain.  No loss of bowel or bladder, No numbness or tingling and no weakness of upper or lower extremities. FROM of extremities. No pain on palpation of back.  Gait wnl, 5/5 equal grip strength bilaterally. 5/5 strength upper extremity and 5/5 strength lower strength.  Diagnosis Muscle spasm of the back resolved.  Medications Prescribed  No orders of the defined types were placed in this encounter.   Referred to No need to return to clinic.    Return to Work Status May return to work full duty. Note completed.   Provider Signature ________________________________________Date_________   Employee Signature _______________________________________Date_________   Please email this completed form to Angus Seller,  Director of Risk Management at [email or web address] within 24 hours of visit.

## 2021-11-17 ENCOUNTER — Encounter: Payer: Self-pay | Admitting: Internal Medicine

## 2021-11-17 ENCOUNTER — Other Ambulatory Visit: Payer: Self-pay

## 2021-11-17 ENCOUNTER — Ambulatory Visit: Payer: Self-pay | Admitting: Internal Medicine

## 2021-11-17 VITALS — BP 121/85 | HR 77 | Temp 97.8°F | Wt 171.0 lb

## 2021-11-17 DIAGNOSIS — R350 Frequency of micturition: Secondary | ICD-10-CM

## 2021-11-17 DIAGNOSIS — F419 Anxiety disorder, unspecified: Secondary | ICD-10-CM

## 2021-11-17 DIAGNOSIS — Z8759 Personal history of other complications of pregnancy, childbirth and the puerperium: Secondary | ICD-10-CM

## 2021-11-17 DIAGNOSIS — F32A Depression, unspecified: Secondary | ICD-10-CM

## 2021-11-17 DIAGNOSIS — R300 Dysuria: Secondary | ICD-10-CM

## 2021-11-17 LAB — POCT URINALYSIS DIPSTICK
Bilirubin, UA: NEGATIVE
Blood, UA: NEGATIVE
Glucose, UA: NEGATIVE
Ketones, UA: NEGATIVE
Nitrite, UA: NEGATIVE
Protein, UA: NEGATIVE
Spec Grav, UA: <=1.005 — AB (ref 1.010–1.025)
Urobilinogen, UA: 0.2 E.U./dL
pH, UA: 6 (ref 5.0–8.0)

## 2021-11-17 MED ORDER — CEPHALEXIN 500 MG PO CAPS: 500.0000 mg | ORAL_CAPSULE | Freq: Two times a day (BID) | ORAL | 0 refills | Status: DC

## 2021-11-17 NOTE — Progress Notes (Signed)
HPI ? ?Pt presents to the clinic today with c/o urinary frequency and discomfort.  She reports this started 5 days ago.  She denies urgency, dysuria, urine odor, cloudy urine or blood in urine.  She denies vaginal discharge, odor or abnormal bleeding.  She denies pelvic pain, low back pain, fever, chills, nausea or vomiting.  She has tried AZO OTC with some relief of symptoms. ? ?She also reports she had a miscarriage in December.  She has struggled with anxiety and depression since that time.  She reports she has restarted her Escitalopram and seems to be feeling better.  She denies SI/HI. ? ?Review of Systems ? ?Past Medical History:  ?Diagnosis Date  ? Anxiety   ? Contraception   ? Depression   ? Sleep disturbance   ? ? ?Family History  ?Problem Relation Age of Onset  ? Diabetes Paternal Grandfather   ? Breast cancer Paternal Grandmother   ? Breast cancer Paternal Aunt   ? Hypertension Mother   ? Hearing loss Maternal Aunt   ? Hearing loss Maternal Uncle   ? Heart disease Maternal Grandmother   ? Heart disease Maternal Grandfather   ? ? ?Social History  ? ?Socioeconomic History  ? Marital status: Single  ?  Spouse name: Not on file  ? Number of children: Not on file  ? Years of education: Not on file  ? Highest education level: Not on file  ?Occupational History  ? Not on file  ?Tobacco Use  ? Smoking status: Never  ? Smokeless tobacco: Never  ?Vaping Use  ? Vaping Use: Never used  ?Substance and Sexual Activity  ? Alcohol use: Yes  ?  Alcohol/week: 0.0 standard drinks  ?  Comment: social  ? Drug use: No  ?  Comment: THC--recently quit  ? Sexual activity: Yes  ?Other Topics Concern  ? Not on file  ?Social History Narrative  ? Not on file  ? ?Social Determinants of Health  ? ?Financial Resource Strain: Not on file  ?Food Insecurity: Not on file  ?Transportation Needs: Not on file  ?Physical Activity: Not on file  ?Stress: Not on file  ?Social Connections: Not on file  ?Intimate Partner Violence: Not on file   ? ? ?Allergies  ?Allergen Reactions  ? Codeine   ?  REACTION: hallucinations  ? Morphine   ?  REACTION: hallucinations  ? Shrimp [Shellfish Allergy] Other (See Comments)  ?  Vomiting, fever  ? Sulfa Antibiotics   ? Tamiflu [Oseltamivir Phosphate]   ? ?  ?Constitutional: Denies fever, malaise, fatigue, headache or abrupt weight changes.   ?GU: Pt reports urgency, frequency and discomfort with urination. Denies urgency, dysuria, burning sensation, blood in urine, odor or discharge. ?Skin: Denies redness, rashes, lesions or ulcercations.  ?Psych: Patient reports anxiety and depression.  Denies SI/HI. ? ?No other specific complaints in a complete review of systems (except as listed in HPI above). ? ?  ?Objective:  ? Physical Exam ?BP 121/85 (BP Location: Right Arm, Patient Position: Sitting, Cuff Size: Large)   Pulse 77   Temp 97.8 ?F (36.6 ?C) (Temporal)   Wt 171 lb (77.6 kg)   SpO2 100%   BMI 26.78 kg/m?  ? ?Wt Readings from Last 3 Encounters:  ?05/29/21 172 lb (78 kg)  ?05/22/21 176 lb 9.6 oz (80.1 kg)  ?05/19/21 165 lb (74.8 kg)  ? ? ?General: Appears her stated age, overweight, in NAD. ?Cardiovascular: Normal rat. ?Pulmonary/Chest: Normal effort. ?Abdomen: Nontender. No CVA tenderness. ?Neuro:  Alert and oriented. ?Psych: Mood and affect normal.  Behavior normal.  Judgment and thought content normal. ? ?     ?Assessment & Plan:  ?Urinary Frequency, Discomfort with Urination:  ? ?Urinalysis: 2+ leuks ?Will send urine culture ?eRx sent if for Keflex 500 mg BID x 5 days ?OK to take AZO OTC ?Drink plenty of fluids ? ?RTC as needed or if symptoms persist. ?Webb Silversmith, NP ?This visit occurred during the SARS-CoV-2 public health emergency.  Safety protocols were in place, including screening questions prior to the visit, additional usage of staff PPE, and extensive cleaning of exam room while observing appropriate contact time as indicated for disinfecting solutions.  ? ?

## 2021-11-17 NOTE — Patient Instructions (Signed)

## 2021-11-17 NOTE — Assessment & Plan Note (Signed)
Recurrent ?Her symptoms are well managed on Escitalopram ?Support offered ?

## 2021-11-20 LAB — URINE CULTURE
MICRO NUMBER:: 13086853
SPECIMEN QUALITY:: ADEQUATE

## 2022-09-23 IMAGING — CR DG CHEST 2V
1 series · 2 of 2 positions shown · non-contrast
Comparison: None.

CLINICAL DATA: Chest pain, wheezing, and shortness of breath.
Inhalational injury with Round-up.

EXAM:
CHEST - 2 VIEW

[Series 1: dg chest 2 view · 0.14mm/px · 2 of 2 slices shown]
[im 1/2]
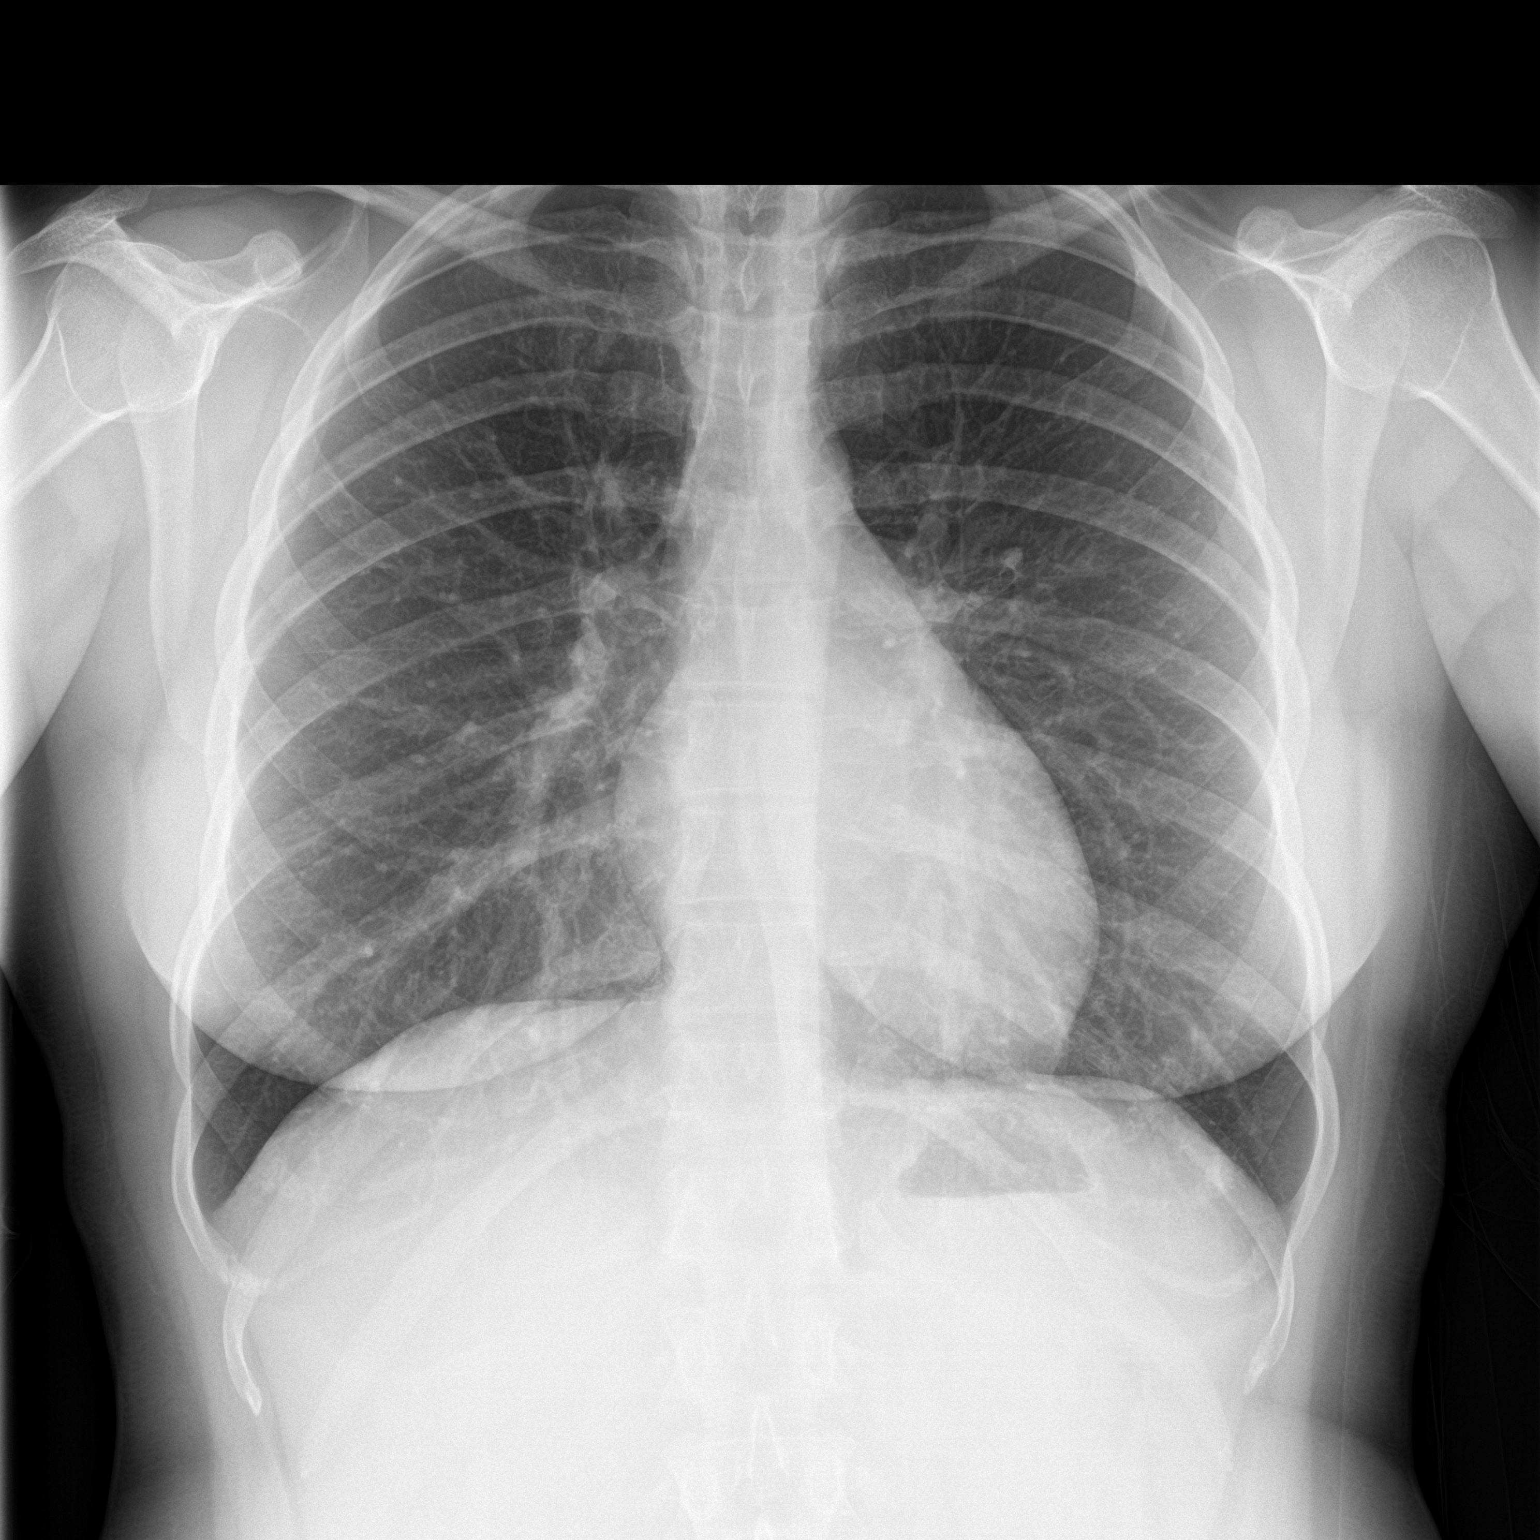
[im 2/2]
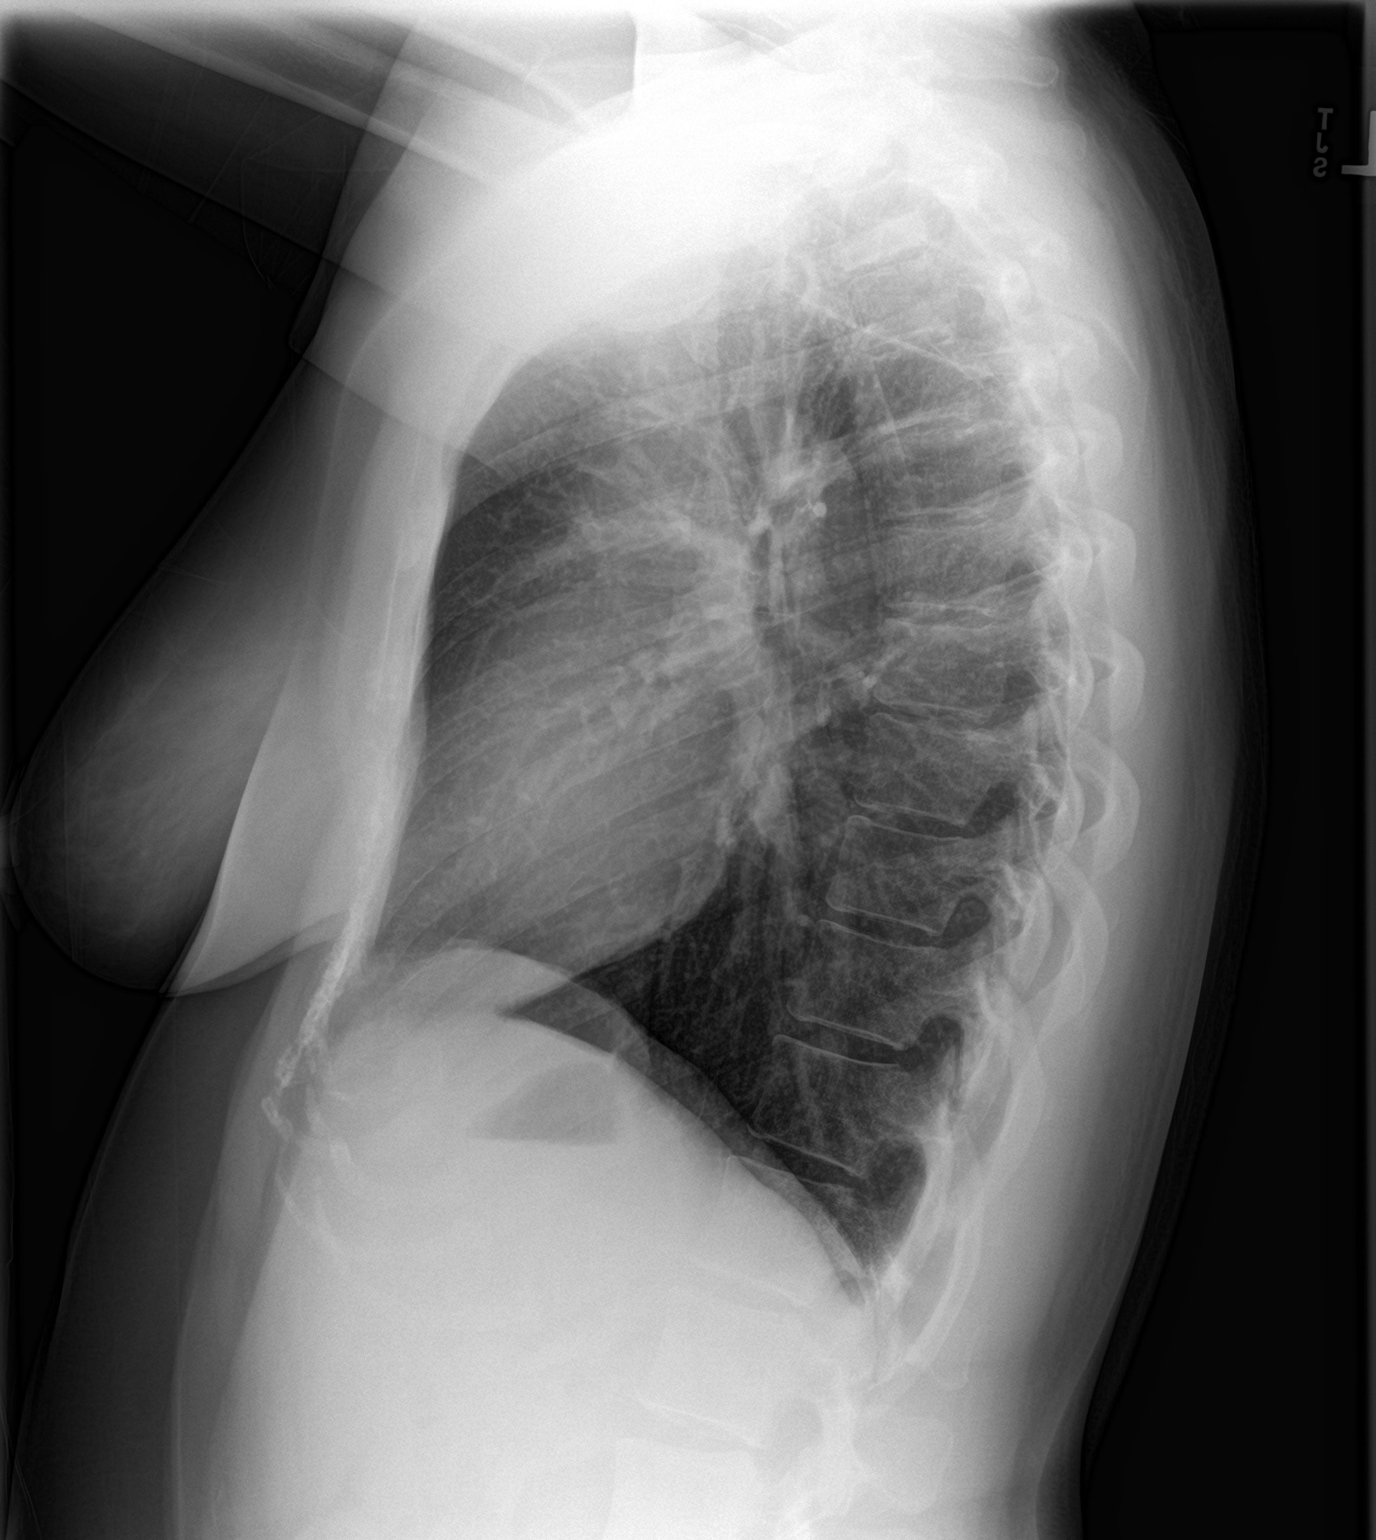

[2 of 2 positions shown; findings below may reference images not displayed]

FINDINGS: The heart size and mediastinal contours are within normal limits.
Both lungs are clear. The visualized skeletal structures are
unremarkable.
IMPRESSION: No active cardiopulmonary disease.

## 2024-01-17 ENCOUNTER — Ambulatory Visit (INDEPENDENT_AMBULATORY_CARE_PROVIDER_SITE_OTHER): Admitting: Internal Medicine

## 2024-01-17 ENCOUNTER — Ambulatory Visit
Admission: RE | Admit: 2024-01-17 | Discharge: 2024-01-17 | Disposition: A | Discharge status: Home / Self Care | Source: Ambulatory Visit | Attending: Internal Medicine | Admitting: Internal Medicine

## 2024-01-17 ENCOUNTER — Encounter: Payer: Self-pay | Admitting: Internal Medicine

## 2024-01-17 VITALS — BP 118/74 | Ht 67.0 in | Wt 140.4 lb

## 2024-01-17 DIAGNOSIS — R102 Pelvic and perineal pain: Secondary | ICD-10-CM | POA: Insufficient documentation

## 2024-01-17 DIAGNOSIS — N854 Malposition of uterus: Secondary | ICD-10-CM | POA: Diagnosis not present

## 2024-01-17 DIAGNOSIS — Z3201 Encounter for pregnancy test, result positive: Secondary | ICD-10-CM

## 2024-01-17 DIAGNOSIS — O26891 Other specified pregnancy related conditions, first trimester: Secondary | ICD-10-CM | POA: Diagnosis not present

## 2024-01-17 DIAGNOSIS — N83291 Other ovarian cyst, right side: Secondary | ICD-10-CM | POA: Diagnosis not present

## 2024-01-17 DIAGNOSIS — Z8759 Personal history of other complications of pregnancy, childbirth and the puerperium: Secondary | ICD-10-CM

## 2024-01-17 DIAGNOSIS — Z3A00 Weeks of gestation of pregnancy not specified: Secondary | ICD-10-CM | POA: Diagnosis not present

## 2024-01-17 LAB — HCG, QUANTITATIVE, PREGNANCY: HCG, Total, QN: 3735 m[IU]/mL

## 2024-01-17 LAB — POCT URINE PREGNANCY: Preg Test, Ur: POSITIVE — AB

## 2024-01-17 NOTE — Addendum Note (Signed)
 Addended by: Carollynn Cirri on: 01/17/2024 12:56 PM   Modules accepted: Orders

## 2024-01-17 NOTE — Patient Instructions (Signed)
 Abdominal Pain During Pregnancy Abdominal pain, or belly pain, is common during pregnancy. There are many causes. Some causes are more serious than others. Sometimes the cause is not known. Always tell your health care provider if you have pain in your belly. Pain in your belly can be a sign that labor is starting. It can also be caused by normal growth of your baby. Normal growth of your baby stretches your muscles and ligaments, which causes pain. Follow these instructions at home: Do not have sex or put anything in your vagina until your pain goes away. Get plenty of rest until your pain gets better. Drink enough fluid to keep your pee (urine) pale yellow. Take your medicines only as told by your provider. Contact a health care provider if:  Your pain does not get better or gets worse after resting. You have pain in your lower belly, and the pain: Comes and goes at regular times. Spreads to your back. Feels like cramps that you get during a period. You have pain or burning when you pee. Get help right away if: You have a fever or chills. You feel very weak or faint. You feel like it's hard to breathe. You have very bad pain in your upper belly. You have bleeding from your vagina. You are leaking fluid or tissue from your vagina. You vomit for more than 24 hours. You have watery poop (diarrhea) for more than 24 hours. Your baby is moving less than usual. These symptoms may be an emergency. Get help right away. Call 911. Do not wait to see if the symptoms will go away. Do not drive yourself to the hospital. This information is not intended to replace advice given to you by your health care provider. Make sure you discuss any questions you have with your health care provider. Document Revised: 07/23/2023 Document Reviewed: 12/14/2022 Elsevier Patient Education  2024 [employer].

## 2024-01-17 NOTE — Progress Notes (Signed)
 Subjective:    Patient ID: Caroline Gonzalez, female    DOB: 1996-01-12, 28 y.o.   MRN: [id number]  HPI  Discussed the use of AI scribe software for clinical note transcription with the patient, who gave verbal consent to proceed.  Caroline Gonzalez is a 28 year old female who presents with a positive home pregnancy test and abdominal cramping.  She took a home pregnancy test last night and another this morning, both of which were positive. She has not had a menstrual period since March 1st through 6th, indicating she is approximately two months without a period. She experiences significant abdominal cramping but no vaginal bleeding or discharge.  She reports symptoms of fatigue, nausea, and breast tenderness, but no vomiting. The cramping is described as severe and different from her usual menstrual cramps, which prompted her to take the pregnancy test.  Her past medical history includes a miscarriage two years ago, where she had a positive pregnancy test followed by heavy bleeding and a subsequent negative test. She has not been using any form of birth control and was not actively trying to conceive.  She has one child, a son who is 71 years old, and her husband has three other children. She does not currently have an OB GYN and has not started any prenatal vitamins as she just found out about the pregnancy.       Review of Systems   Past Medical History:  Diagnosis Date   Anxiety    Contraception    Depression    Sleep disturbance     Current Outpatient Medications  Medication Sig Dispense Refill   cephALEXin  (KEFLEX ) 500 MG capsule Take 1 capsule (500 mg total) by mouth 2 (two) times daily. 10 capsule 0   escitalopram  (LEXAPRO ) 20 MG tablet Take 1 tablet (20 mg total) by mouth daily. 90 tablet 3   No current facility-administered medications for this visit.    Allergies  Allergen Reactions   Codeine     REACTION: hallucinations   Morphine     REACTION:  hallucinations   Shrimp [Shellfish Allergy] Other (See Comments)    Vomiting, fever   Sulfa Antibiotics    Tamiflu  [Oseltamivir  Phosphate]     Family History  Problem Relation Age of Onset   Diabetes Paternal Grandfather    Breast cancer Paternal Grandmother    Breast cancer Paternal Aunt    Hypertension Mother    Hearing loss Maternal Aunt    Hearing loss Maternal Uncle    Heart disease Maternal Grandmother    Heart disease Maternal Grandfather     Social History   Socioeconomic History   Marital status: Single    Spouse name: Not on file   Number of children: Not on file   Years of education: Not on file   Highest education level: Not on file  Occupational History   Not on file  Tobacco Use   Smoking status: Never   Smokeless tobacco: Never  Vaping Use   Vaping status: Never Used  Substance and Sexual Activity   Alcohol use: Yes    Alcohol/week: 0.0 standard drinks of alcohol    Comment: social   Drug use: No    Comment: THC--recently quit   Sexual activity: Yes  Other Topics Concern   Not on file  Social History Narrative   Not on file   Social Drivers of Health   Financial Resource Strain: Not on file  Food Insecurity: Not on file  Transportation Needs: Not on file  Physical Activity: Not on file  Stress: Not on file  Social Connections: Not on file  Intimate Partner Violence: Not on file     Constitutional: Pt reports fatigue. Denies fever, malaise, headache or abrupt weight changes.  HEENT: Denies eye pain, eye redness, ear pain, ringing in the ears, wax buildup, runny nose, nasal congestion, bloody nose, or sore throat. Respiratory: Denies difficulty breathing, shortness of breath, cough or sputum production.   Cardiovascular: Denies chest pain, chest tightness, palpitations or swelling in the hands or feet.  Gastrointestinal: Pt reports nausea, pelvic cramping. Denies abdominal pain, bloating, constipation, diarrhea or blood in the stool.  GU:  Denies urgency, frequency, pain with urination, burning sensation, blood in urine, odor or discharge. Musculoskeletal: Denies decrease in range of motion, difficulty with gait, muscle pain or joint pain and swelling.  Skin: Pt reports breast tenderness. Denies redness, rashes, lesions or ulcercations.  Neurological: Denies dizziness, difficulty with memory, difficulty with speech or problems with balance and coordination.  Psych: Denies anxiety, depression, SI/HI.  No other specific complaints in a complete review of systems (except as listed in HPI above).      Objective:   Physical Exam  BP 118/74 (BP Location: Left Arm, Patient Position: Sitting, Cuff Size: Normal)   Ht 5\' 7"  (1.702 m)   Wt 140 lb 6.4 oz (63.7 kg)   LMP 11/16/2023 (Approximate)   BMI 21.99 kg/m   Wt Readings from Last 3 Encounters:  11/17/21 171 lb (77.6 kg)  05/29/21 172 lb (78 kg)  05/22/21 176 lb 9.6 oz (80.1 kg)    General: Appears her stated age, well developed, well nourished in NAD. Cardiovascular: Normal rate and rhythm. S1,S2 noted.  No murmur, rubs or gallops noted.  Pulmonary/Chest: Normal effort and positive vesicular breath sounds. No respiratory distress. No wheezes, rales or ronchi noted.  Abdomen: Deferred. Musculoskeletal: No difficulty with gait.  Neurological: Alert and oriented.  Psychiatric: Tearful.   BMET    Component Value Date/Time   NA 136 03/27/2021 0818   NA 144 07/19/2017 1704   NA 140 11/30/2012 2156   K 4.3 03/27/2021 0818   K 3.5 11/30/2012 2156   CL 102 03/27/2021 0818   CL 106 11/30/2012 2156   CO2 25 03/27/2021 0818   CO2 28 (H) 11/30/2012 2156   GLUCOSE 89 03/27/2021 0818   GLUCOSE 85 11/30/2012 2156   BUN 10 03/27/2021 0818   BUN 13 07/19/2017 1704   BUN 7 (L) 11/30/2012 2156   CREATININE 0.77 03/27/2021 0818   CALCIUM 9.8 03/27/2021 0818   CALCIUM 9.1 11/30/2012 2156   GFRNONAA 101 07/19/2017 1704   GFRAA 117 07/19/2017 1704    Lipid Panel      Component Value Date/Time   CHOL 235 (H) 03/27/2021 0818   TRIG 112 03/27/2021 0818   HDL 70 03/27/2021 0818   CHOLHDL 3.4 03/27/2021 0818   VLDL 21.6 11/23/2019 1613   LDLCALC 142 (H) 03/27/2021 0818    CBC    Component Value Date/Time   WBC 6.1 03/27/2021 0818   RBC 4.89 03/27/2021 0818   HGB 14.1 03/27/2021 0818   HGB 13.4 07/19/2017 1704   HCT 42.2 03/27/2021 0818   HCT 40.3 07/19/2017 1704   PLT 339 03/27/2021 0818   PLT 326 07/19/2017 1704   MCV 86.3 03/27/2021 0818   MCV 86 07/19/2017 1704   MCV 88 09/06/2014 0938   MCH 28.8 03/27/2021 0818   MCHC 33.4  03/27/2021 0818   RDW 12.1 03/27/2021 0818   RDW 13.1 07/19/2017 1704   RDW 13.1 09/06/2014 0938   LYMPHSABS 2.3 09/06/2014 0938   MONOABS 1.5 (H) 09/06/2014 0938   EOSABS 0.1 09/06/2014 0938   BASOSABS 0.1 09/06/2014 0938    Hgb A1C Lab Results  Component Value Date   HGBA1C 5.0 03/27/2021            Assessment & Plan:   Assessment and Plan    Pregnancy Positive home pregnancy test with amenorrhea for two months. Symptoms include cramping, fatigue, nausea, and breast tenderness. History of miscarriage. She intends to continue the pregnancy if viable. -Urine pregnancy positive - Order quantitative serum HCG. - Order stat ultrasound. - Advise contacting an OB GYN for prenatal care. - Discuss prenatal vitamins upon confirmation.       Schedule an appointment for your annual exam Helayne Lo, NP

## 2024-01-17 NOTE — Addendum Note (Signed)
 Addended by: Carollynn Cirri on: 01/17/2024 01:03 PM   Modules accepted: Orders

## 2024-01-19 ENCOUNTER — Encounter: Payer: Self-pay | Admitting: Internal Medicine

## 2024-01-21 ENCOUNTER — Telehealth: Payer: Self-pay

## 2024-01-21 DIAGNOSIS — Z3201 Encounter for pregnancy test, result positive: Secondary | ICD-10-CM

## 2024-01-21 NOTE — Addendum Note (Signed)
 Addended by: Carollynn Cirri on: 01/21/2024 02:26 PM   Modules accepted: Orders

## 2024-01-21 NOTE — Telephone Encounter (Signed)
 I placed a referral to see if it would speed up the process however I can see in the system that they received her phone call and are working on scheduling her an appointment.

## 2024-01-21 NOTE — Telephone Encounter (Signed)
 She is pregnant.  They did see a gestational sac.  They did not see an embryo with a heartbeat because they think she is too early to see this.  There is a simple cyst on her right kidney that could be causing her increased pain and cramping but nothing to be overly concerned about at this point.  Her hCG levels confirmed that she is 3 to [redacted] weeks pregnant.  I see something in her chart about OB/GYN today.  Has someone called her with an appointment?  If she still cramping?  Is she having any bleeding?

## 2024-01-21 NOTE — Telephone Encounter (Signed)
 Caroline Gonzalez came in Thursday I believe.  She got her results on My chart but not sure what it means. She had blood work and an ultra sound. She's really worried and trying to get in with an OB but not having much [name]. She wants to make sure the baby is ok and if the cyst is anything to worry about. You can reach her at [phone]. Thanks

## 2024-02-04 ENCOUNTER — Telehealth: Payer: Self-pay

## 2024-02-04 MED ORDER — ONDANSETRON 4 MG PO TBDP: 4.0000 mg | ORAL_TABLET | Freq: Three times a day (TID) | ORAL | 0 refills | Status: DC | PRN

## 2024-02-04 NOTE — Addendum Note (Signed)
 Addended by: Carollynn Cirri on: 02/04/2024 01:47 PM   Modules accepted: Orders

## 2024-02-04 NOTE — Telephone Encounter (Signed)
 Genevia Kern wrote in on her MyChart the following -   Is there anyway you can call something in for Caroline Gonzalez for morning sickness? Really all day sickness. She is going to go to [facility] but they are not doing her paperwork until this Friday and then it will be another week before a check up. She uses [address]. Thanks! If she can just get something to get her thru till she can see the ob.   Please advise. Thanks!

## 2024-02-04 NOTE — Telephone Encounter (Signed)
 Zofran sent to pharmacy

## 2024-02-07 ENCOUNTER — Telehealth (INDEPENDENT_AMBULATORY_CARE_PROVIDER_SITE_OTHER)

## 2024-02-07 DIAGNOSIS — Z3689 Encounter for other specified antenatal screening: Secondary | ICD-10-CM

## 2024-02-07 DIAGNOSIS — Z3480 Encounter for supervision of other normal pregnancy, unspecified trimester: Secondary | ICD-10-CM | POA: Insufficient documentation

## 2024-02-07 DIAGNOSIS — Z8759 Personal history of other complications of pregnancy, childbirth and the puerperium: Secondary | ICD-10-CM | POA: Insufficient documentation

## 2024-02-07 NOTE — Patient Instructions (Signed)
 First Trimester of Pregnancy  The first trimester of pregnancy starts on the first day of your last monthly period until the end of week 13. This is months 1 through 3 of pregnancy. A week after a sperm fertilizes an egg, the egg will implant into the wall of the uterus and begin to develop into a baby. Body changes during your first trimester Your body goes through many changes during pregnancy. The changes usually return to normal after your baby is born. Physical changes Your breasts may grow larger and may hurt. The area around your nipples may get darker. Your periods will stop. Your hair and nails may grow faster. You may pee more often. Health changes You may tire easily. Your gums may bleed and may be sensitive when you brush and floss. You may not feel hungry. You may have heartburn. You may throw up or feel like you may throw up. You may want to eat some foods, but not others. You may have headaches. You may have trouble pooping (constipation). Other changes Your emotions may change from day to day. You may have more dreams. Follow these instructions at home: Medicines Talk to your health care provider if you're taking medicines. Ask if the medicines are safe to take during pregnancy. Your provider may change the medicines that you take. Do not take any medicines unless told to by your provider. Take a prenatal vitamin that has at least 600 micrograms (mcg) of folic acid. Do not use herbal medicines, illegal substances, or medicines that are not approved by your provider. Eating and drinking While you're pregnant your body needs extra food for your growing baby. Talk with your provider about what to eat while pregnant. Activity Most women are able to exercise during pregnancy. Exercises may need to change as your pregnancy goes on. Talk to your provider about your activities and exercise routines. Relieving pain and discomfort Wear a good, supportive bra if your breasts  hurt. Rest with your legs raised if you have leg cramps or low back pain. Safety Wear your seatbelt at all times when you're in a car. Talk to your provider if someone hits you, hurts you, or yells at you. Talk with your provider if you're feeling sad or have thoughts of hurting yourself. Lifestyle Certain things can be harmful while you're pregnant. Follow these rules: Do not use hot tubs, steam rooms, or saunas. Do not douche. Do not use tampons or scented pads. Do not drink alcohol,smoke, vape, or use products with nicotine or tobacco in them. If you need help quitting, talk with your provider. Avoid cat litter boxes and soil used by cats. These things carry germs that can cause harm to your pregnancy and your baby. General instructions Keep all follow-up visits. It helps you and your unborn baby stay as healthy as possible. Write down your questions. Take them to your visits. Your provider will: Talk with you about your overall health. Give you advice or refer you to specialists who can help with different needs, including: Prenatal education classes. Mental health and counseling. Foods and healthy eating. Ask for help if you need help with food. Call your dentist and ask to be seen. Brush your teeth with a soft toothbrush. Floss gently. Where to find more information American Pregnancy Association: americanpregnancy.org [employer]: acog.org Office on [employer] Health: TravelLesson.ca Contact a health care provider if: You feel dizzy, faint, or have a fever. You vomit or have watery poop (diarrhea) for 2  days or more. You have abnormal discharge or bleeding from your vagina. You have pain when you pee or your pee smells bad. You have cramps, pain, or pressure in your belly area. Get help right away if: You have trouble breathing or chest pain. You have any kind of injury, such as from a fall or a car crash. These symptoms may be an  emergency. Get help right away. Call 911. Do not wait to see if the symptoms will go away. Do not drive yourself to the hospital. This information is not intended to replace advice given to you by your health care provider. Make sure you discuss any questions you have with your health care provider. Document Revised: 06/06/2023 Document Reviewed: 01/04/2023 Elsevier Patient Education  2024 Elsevier Inc.   Common Medications Safe in Pregnancy  Acne:      Constipation:  Benzoyl Peroxide     Colace  Clindamycin      Dulcolax Suppository  Topica Erythromycin     Fibercon  Salicylic Acid      Metamucil         Miralax AVOID:        Senakot   Accutane    Cough:  Retin-A       Cough Drops  Tetracycline      Phenergan w/ Codeine if Rx  Minocycline      Robitussin (Plain & DM)  Antibiotics:     Crabs/Lice:  Ceclor       RID  Cephalosporins    AVOID:  E-Mycins      Kwell  Keflex  Macrobid/Macrodantin   Diarrhea:  Penicillin      Kao-Pectate  Zithromax      Imodium AD         PUSH FLUIDS AVOID:       Cipro     Fever:  Tetracycline      Tylenol (Regular or Extra  Minocycline       Strength)  Levaquin      Extra Strength-Do not          Exceed 8 tabs/24 hrs Caffeine:        200mg /day (equiv. To 1 cup of coffee or  approx. 3 12 oz sodas)         Gas: Cold/Hayfever:       Gas-X  Benadryl      Mylicon  Claritin       Phazyme  **Claritin-D        Chlor-Trimeton    Headaches:  Dimetapp      ASA-Free Excedrin  Drixoral-Non-Drowsy     Cold Compress  Mucinex (Guaifenasin)     Tylenol (Regular or Extra  Sudafed/Sudafed-12 Hour     Strength)  **Sudafed PE Pseudoephedrine   Tylenol Cold & Sinus     Vicks Vapor Rub  Zyrtec  **AVOID if Problems With Blood Pressure         Heartburn: Avoid lying down for at least 1 hour after meals  Aciphex      Maalox     Rash:  Milk of Magnesia     Benadryl    Mylanta       1% Hydrocortisone Cream  Pepcid  Pepcid Complete   Sleep  Aids:  Prevacid      Ambien   Prilosec       Benadryl  Rolaids       Chamomile Tea  Tums (Limit 4/day)     Unisom  Tylenol PM         Warm milk-add vanilla or  Hemorrhoids:       Sugar for taste  Anusol/Anusol H.C.  (RX: Analapram 2.5%)  Sugar Substitutes:  Hydrocortisone OTC     Ok in moderation  Preparation H      Tucks        Vaseline lotion applied to tissue with wiping    Herpes:     Throat:  Acyclovir      Oragel  Famvir  Valtrex     Vaccines:         Flu Shot Leg Cramps:       *Gardasil  Benadryl      Hepatitis A         Hepatitis B Nasal Spray:       Pneumovax  Saline Nasal Spray     Polio Booster         Tetanus Nausea:       Tuberculosis test or PPD  Vitamin B6 25 mg TID   AVOID:    Dramamine      *Gardasil  Emetrol       Live Poliovirus  Ginger Root 250 mg QID    MMR (measles, mumps &  High Complex Carbs @ Bedtime    rebella)  Sea Bands-Accupressure    Varicella (Chickenpox)  Unisom 1/2 tab TID     *No known complications           If received before Pain:         Known pregnancy;   Darvocet       Resume series after  Lortab        Delivery  Percocet    Yeast:   Tramadol      Femstat  Tylenol 3      Gyne-lotrimin  Ultram       Monistat  Vicodin           MISC:         All Sunscreens           Hair Coloring/highlights          Insect Repellant's          (Including DEET)         Mystic Tans   Commonly Asked Questions During Pregnancy   Cats: A parasite can be excreted in cat feces.  To avoid exposure you need to have another person empty the little box.  If you must empty the litter box you will need to wear gloves.  Wash your hands after handling your cat.  This parasite can also be found in raw or undercooked meat so this should also be avoided.  Colds, Sore Throats, Flu: Please check your medication sheet to see what you can take for symptoms.  If your symptoms are unrelieved by these medications please call the office.  Dental Work: Most  any dental work Agricultural consultant recommends is permitted.  X-rays should only be taken during the first trimester if absolutely necessary.  Your abdomen should be shielded with a lead apron during all x-rays.  Please notify your provider prior to receiving any x-rays.  Novocaine is fine; gas is not recommended.  If your dentist requires a note from [place] prior to dental work please call the office and we will provide one for you.  Exercise: Exercise is an important part of staying healthy during your pregnancy.  You may continue most exercises you were accustomed to prior to pregnancy.  Later in your pregnancy you will most likely notice you have difficulty with activities requiring balance like riding a bicycle.  It is important that you listen to your body and avoid activities that put you at a higher risk of falling.  Adequate rest and staying well hydrated are a must!  If you have questions about the safety of specific activities ask your provider.    Exposure to Children with illness: Try to avoid obvious exposure; report any symptoms to [place] when noted,  If you have chicken pos, red measles or mumps, you should be immune to these diseases.   Please do not take any vaccines while pregnant unless you have checked with your OB provider.  Fetal Movement: After 28 weeks we recommend you do "kick counts" twice daily.  Lie or sit down in a calm quiet environment and count your baby movements "kicks".  You should feel your baby at least 10 times per hour.  If you have not felt 10 kicks within the first hour get up, walk around and have something sweet to eat or drink then repeat for an additional hour.  If count remains less than 10 per hour notify your provider.  Fumigating: Follow your pest control agent's advice as to how long to stay out of your home.  Ventilate the area well before re-entering.  Hemorrhoids:   Most over-the-counter preparations can be used during pregnancy.  Check your medication to see what is  safe to use.  It is important to use a stool softener or fiber in your diet and to drink lots of liquids.  If hemorrhoids seem to be getting worse please call the office.   Hot Tubs:  Hot tubs Jacuzzis and saunas are not recommended while pregnant.  These increase your internal body temperature and should be avoided.  Intercourse:  Sexual intercourse is safe during pregnancy as long as you are comfortable, unless otherwise advised by your provider.  Spotting may occur after intercourse; report any bright red bleeding that is heavier than spotting.  Labor:  If you know that you are in labor, please go to the hospital.  If you are unsure, please call the office and let us help you decide what to do.  Lifting, straining, etc:  If your job requires heavy lifting or straining please check with your provider for any limitations.  Generally, you should not lift items heavier than that you can lift simply with your hands and arms (no back muscles)  Painting:  Paint fumes do not harm your pregnancy, but may make you ill and should be avoided if possible.  Latex or water based paints have less odor than oils.  Use adequate ventilation while painting.  Permanents & Hair Color:  Chemicals in hair dyes are not recommended as they cause increase hair dryness which can increase hair loss during pregnancy.  " Highlighting" and permanents are allowed.  Dye may be absorbed differently and permanents may not hold as well during pregnancy.  Sunbathing:  Use a sunscreen, as skin burns easily during pregnancy.  Drink plenty of fluids; avoid over heating.  Tanning Beds:  Because their possible side effects are still unknown, tanning beds are not recommended.  Ultrasound Scans:  Routine ultrasounds are performed at approximately 20 weeks.  You will be able to see your baby's general anatomy an if you would like to know the gender this can usually be determined as well.  If it is questionable when you conceived you may  also  receive an ultrasound early in your pregnancy for dating purposes.  Otherwise ultrasound exams are not routinely performed unless there is a medical necessity.  Although you can request a scan we ask that you pay for it when conducted because insurance does not cover " patient request" scans.  Work: If your pregnancy proceeds without complications you may work until your due date, unless your physician or employer advises otherwise.  Round Ligament Pain/Pelvic Discomfort:  Sharp, shooting pains not associated with bleeding are fairly common, usually occurring in the second trimester of pregnancy.  They tend to be worse when standing up or when you remain standing for long periods of time.  These are the result of pressure of certain pelvic ligaments called "round ligaments".  Rest, Tylenol and heat seem to be the most effective relief.  As the womb and fetus grow, they rise out of the pelvis and the discomfort improves.  Please notify the office if your pain seems different than that described.  It may represent a more serious condition.

## 2024-02-07 NOTE — Progress Notes (Signed)
 New OB Intake  I connected with  Caroline Gonzalez on 02/07/24 at  1:15 PM EDT by MyChart Video Visit and verified that I am speaking with the correct person using two identifiers. Nurse is located at [facility] and pt is located at home.  I discussed the limitations, risks, security and privacy concerns of performing an evaluation and management service by telephone and the availability of in person appointments. I also discussed with the patient that there may be a patient responsible charge related to this service. The patient expressed understanding and agreed to proceed.  I explained I am completing New OB Intake today. We discussed her EDD of 09/16/24 that is based on LMP of 12/11/23. Pt is G3/P1. I reviewed her allergies, medications, Medical/Surgical/OB history, and appropriate screenings. There are cats in the home: no. Based on history, this is a/an pregnancy uncomplicated . Her obstetrical history is significant for N/A.  Patient Active Problem List   Diagnosis Date Noted   Supervision of other normal pregnancy, antepartum 02/07/2024   History of 1 spontaneous abortion 02/07/2024   Overweight with body mass index (BMI) of 27 to 27.9 in adult 03/21/2021   Anxiety and depression 04/03/2018    Concerns addressed today: None  Delivery Plans:  Plans to deliver at [facility].  Anatomy US  Explained Anatomy US  will be scheduled around [redacted] weeks gestational age.  Labs Discussed genetic screening with patient. Patient desires genetic testing to be drawn at new OB visit. Discussed possible labs to be drawn at new OB appointment.  COVID Vaccine Patient has not had COVID vaccine.   Social Determinants of Health Food Insecurity: denies food insecurity Transportation: Patient denies transportation needs. Childcare: Discussed no children allowed at ultrasound appointments.   First visit review I reviewed new OB appt with pt. I explained she will have blood work  and pap smear/pelvic exam if indicated. Explained pt will be seen by Quince Bryant, CNM at first visit; encounter routed to appropriate provider.   Higinio Love, CMA 02/07/2024  2:00 PM

## 2024-02-11 ENCOUNTER — Other Ambulatory Visit: Payer: Self-pay

## 2024-02-11 ENCOUNTER — Telehealth: Payer: Self-pay

## 2024-02-11 DIAGNOSIS — Z8759 Personal history of other complications of pregnancy, childbirth and the puerperium: Secondary | ICD-10-CM

## 2024-02-11 NOTE — Telephone Encounter (Signed)
-----   Message from Forestine Igo sent at 02/07/2024  3:09 PM EDT ----- Regarding: RE: Ultrasound Follow Up? She could have serial betas-if it's dropping that's diagnostic of miscarriage, if it's rising then we can try fht at her nob. ----- Message ----- From: Higinio Love, CMA Sent: 02/07/2024   2:06 PM EDT To: Forestine Igo, CNM Subject: Ultrasound Follow Up?                          Hi Jessica, patient had an US  on 01/17/24 and recommendations were:  IMPRESSION: #Single intrauterine gestational sac with mean sac diameter corresponding to 5 weeks 2 days. Embryo is not seen likely due to early gestational age. Repeat examination is recommended in 10-14 days to document satisfactory progression of pregnancy.  Nothing has been scheduled. Do we order another US  or a beta? Patient states US  she had at hospital left her with $1500 bill and would not like another big bill if not necessary. Please advise. NOB physical with you on 03/06/24.

## 2024-02-11 NOTE — Telephone Encounter (Signed)
 Called pt, no answer, LVMTRC. I will try calling again. If she calls back, pls tell her pls let her know Jessica's advise and schedule lab appt only. Beta order in.

## 2024-02-13 NOTE — Telephone Encounter (Signed)
 Tried again, pt aware. Transferred to front desk to schedule lab appt only.

## 2024-02-18 ENCOUNTER — Telehealth: Payer: Self-pay

## 2024-02-18 NOTE — Telephone Encounter (Signed)
 Patient called with concerns about being [redacted] weeks pregnant, with a broken tooth that needs to be repaired with numbing and a filling. She wants to make sure it is ok for her to proceed with this dental procedure. I sent her a dental clearance letter:   02/18/2024   To whom it may concern:  Caroline Gonzalez is a patient in [facility] on 02/18/2024. [name] has medical clearance for the following dental treatment:  Treatment may include the following:     If local anesthetic is needed, can be used.     If antibiotic is needed, Amoxicillian or Clindamycin can be used.     For non-narcotic pain management, OTC Acetaminophen  can be recommended.     For narcotic pain relief, Acetaminophen  with Codeine #3 can be prescribed.  The patient does NOT have medical clearance for the following dental treatment:     Radiographs  (If X-rays are absolutely necessary must use double apron. Preferably in 2nd and 3rd trimester.)  If you have any further questions regarding this patient's health status, please contact us  at [phone].    Doneen Fuelling, CMA [facility]

## 2024-02-20 ENCOUNTER — Other Ambulatory Visit

## 2024-02-20 DIAGNOSIS — Z8759 Personal history of other complications of pregnancy, childbirth and the puerperium: Secondary | ICD-10-CM | POA: Diagnosis not present

## 2024-02-21 LAB — BETA HCG QUANT (REF LAB): hCG Quant: 59942 m[IU]/mL

## 2024-02-22 ENCOUNTER — Ambulatory Visit: Payer: Self-pay | Admitting: Certified Nurse Midwife

## 2024-03-03 MED ORDER — ONDANSETRON 4 MG PO TBDP: 4.0000 mg | ORAL_TABLET | Freq: Three times a day (TID) | ORAL | 0 refills | Status: DC | PRN

## 2024-03-06 ENCOUNTER — Other Ambulatory Visit (HOSPITAL_COMMUNITY)
Admission: RE | Admit: 2024-03-06 | Discharge: 2024-03-06 | Disposition: A | Discharge status: Home / Self Care | Source: Ambulatory Visit | Attending: Certified Nurse Midwife | Admitting: Certified Nurse Midwife

## 2024-03-06 ENCOUNTER — Ambulatory Visit (INDEPENDENT_AMBULATORY_CARE_PROVIDER_SITE_OTHER): Admitting: Certified Nurse Midwife

## 2024-03-06 VITALS — BP 125/77 | HR 87 | Wt 150.0 lb

## 2024-03-06 DIAGNOSIS — Z114 Encounter for screening for human immunodeficiency virus [HIV]: Secondary | ICD-10-CM | POA: Diagnosis not present

## 2024-03-06 DIAGNOSIS — Z113 Encounter for screening for infections with a predominantly sexual mode of transmission: Secondary | ICD-10-CM | POA: Diagnosis not present

## 2024-03-06 DIAGNOSIS — Z1379 Encounter for other screening for genetic and chromosomal anomalies: Secondary | ICD-10-CM | POA: Diagnosis not present

## 2024-03-06 DIAGNOSIS — Z124 Encounter for screening for malignant neoplasm of cervix: Secondary | ICD-10-CM | POA: Insufficient documentation

## 2024-03-06 DIAGNOSIS — Z369 Encounter for antenatal screening, unspecified: Secondary | ICD-10-CM

## 2024-03-06 DIAGNOSIS — Z3480 Encounter for supervision of other normal pregnancy, unspecified trimester: Secondary | ICD-10-CM

## 2024-03-06 DIAGNOSIS — Z3481 Encounter for supervision of other normal pregnancy, first trimester: Secondary | ICD-10-CM

## 2024-03-06 DIAGNOSIS — Z0184 Encounter for antibody response examination: Secondary | ICD-10-CM | POA: Diagnosis not present

## 2024-03-06 DIAGNOSIS — Z3A12 12 weeks gestation of pregnancy: Secondary | ICD-10-CM | POA: Diagnosis not present

## 2024-03-06 NOTE — Progress Notes (Unsigned)
 NEW OB HISTORY AND PHYSICAL  SUBJECTIVE:       Caroline Gonzalez is a 28 y.o. G43P1011 female, Patient's last menstrual period was 12/11/2023 (approximate)., Estimated Date of Delivery: 09/16/24, [redacted]w[redacted]d, presents today for establishment of Prenatal Care. She reports {:313260}   Social history Partner/Relationship: husband, Ernestina Headland Living situation: Ernestina Headland, 10yo son & her parents Work: bank Exercise: before pregnancy exercising regularly, less now due to fatigue Substance use: denies T/E/D  Indications for ASA therapy (per uptodate) One of the following: Previous pregnancy with preeclampsia, especially early onset and with an adverse outcome {yes/no:20286} Multifetal gestation {yes/no:20286} Chronic hypertension {yes/no:20286} Type 1 or 2 diabetes mellitus {yes/no:20286} Chronic kidney disease {yes/no:20286} Autoimmune disease (antiphospholipid syndrome, systemic lupus erythematosus) {yes/no:20286}  Two or more of the following: Nulliparity {yes/no:20286} Obesity (body mass index >30 kg/m2) {yes/no:20286} Family history of preeclampsia in mother or sister {yes/no:20286} Age >=35 years {yes/no:20286} Sociodemographic characteristics (African American race, low socioeconomic level) {yes/no:20286} Personal risk factors (eg, previous pregnancy with low birth weight or small for gestational age infant, previous adverse pregnancy outcome [eg, stillbirth], interval >10 years between pregnancies) {yes/no:20286}   Gynecologic History Patient's last menstrual period was 12/11/2023 (approximate). {Blank multiple:19196:: Normal,Abnormal,Unknown} Contraception: {method:5051} Last Pap: ***. Results were: {norm/abn:16337}  Obstetric History OB History  Gravida Para Term Preterm AB Living  3 1 1  1 1   SAB IAB Ectopic Multiple Live Births  1    1    # Outcome Date GA Lbr Len/2nd Weight Sex Type Anes PTL Lv  3 Current           2 SAB 09/02/21          1 Term 09/06/14 [redacted]w[redacted]d  6 lb 15 oz  (3.147 kg) M Vag-Spont   LIV    Past Medical History:  Diagnosis Date   Anxiety    Contraception    Depression    Sleep disturbance     Past Surgical History:  Procedure Laterality Date   TONSILLECTOMY     WISDOM TOOTH EXTRACTION      Current Outpatient Medications on File Prior to Visit  Medication Sig Dispense Refill   ondansetron  (ZOFRAN -ODT) 4 MG disintegrating tablet Take 1 tablet (4 mg total) by mouth every 8 (eight) hours as needed for nausea or vomiting. 30 tablet 0   Prenatal Vit-Fe Fumarate-FA (PRENATAL PO) Take by mouth daily.     No current facility-administered medications on file prior to visit.    Allergies  Allergen Reactions   Codeine     REACTION: hallucinations   Latex Hives   Morphine     REACTION: hallucinations   Shrimp [Shellfish Allergy] Other (See Comments)    Vomiting, fever   Sulfa Antibiotics    Tamiflu  [Oseltamivir  Phosphate]     Social History   Socioeconomic History   Marital status: Married    Spouse name: Ernestina Headland   Number of children: 1   Years of education: Not on file   Highest education level: Bachelor's degree (e.g., BA, AB, BS)  Occupational History   Not on file  Tobacco Use   Smoking status: Never   Smokeless tobacco: Never  Vaping Use   Vaping status: Never Used  Substance and Sexual Activity   Alcohol use: Not Currently    Comment: social   Drug use: No    Comment: THC--recently quit   Sexual activity: Yes    Partners: Male    Birth control/protection: None  Other Topics Concern   Not on file  Social History Narrative   Not on file   Social Drivers of Health   Financial Resource Strain: Medium Risk (02/07/2024)   Overall Financial Resource Strain (CARDIA)    Difficulty of Paying Living Expenses: Somewhat hard  Food Insecurity: No Food Insecurity (02/07/2024)   Hunger Vital Sign    Worried About Running Out of Food in the Last Year: Never true    Ran Out of Food in the Last Year: Never true  Transportation  Needs: No Transportation Needs (02/07/2024)   PRAPARE - Administrator, Civil Service (Medical): No    Lack of Transportation (Non-Medical): No  Physical Activity: Insufficiently Active (02/07/2024)   Exercise Vital Sign    Days of Exercise per Week: 3 days    Minutes of Exercise per Session: 30 min  Stress: Stress Concern Present (02/07/2024)   [employer] of Occupational Health - Occupational Stress Questionnaire    Feeling of Stress : To some extent  Social Connections: Moderately Integrated (02/07/2024)   Social Connection and Isolation Panel    Frequency of Communication with Friends and Family: More than three times a week    Frequency of Social Gatherings with Friends and Family: Once a week    Attends Religious Services: More than 4 times per year    Active Member of [employer] or Organizations: No    Attends Engineer, structural: Not on file    Marital Status: Married  Catering manager Violence: Not At Risk (02/07/2024)   Humiliation, Afraid, Rape, and Kick questionnaire    Fear of Current or Ex-Partner: No    Emotionally Abused: No    Physically Abused: No    Sexually Abused: No    Family History  Problem Relation Age of Onset   Hypertension Mother    Diabetes Mother    Heart disease Maternal Grandmother    Heart disease Maternal Grandfather    Breast cancer Paternal Grandmother        late 1s   Diabetes Paternal Grandfather    Hearing loss Maternal Aunt    Hearing loss Maternal Uncle    Breast cancer Paternal Aunt        mid to late 30s   Breast cancer Paternal Aunt        unknown age   Breast cancer Paternal Aunt        unknown age    The following portions of the patient's history were reviewed and updated as appropriate: allergies, current medications, past OB history, past medical history, past surgical history, past family history, past social history, and problem list.  Constitutional: Denied constitutional symptoms, night sweats,  recent illness, fatigue, fever, insomnia and weight loss.  Eyes: Denied eye symptoms, eye pain, photophobia, vision change and visual disturbance.  Ears/Nose/Throat/Neck: Denied ear, nose, throat or neck symptoms, hearing loss, nasal discharge, sinus congestion and sore throat.  Cardiovascular: Denied cardiovascular symptoms, arrhythmia, chest pain/pressure, edema, exercise intolerance, orthopnea and palpitations.  Respiratory: Denied pulmonary symptoms, asthma, pleuritic pain, productive sputum, cough, dyspnea and wheezing.  Gastrointestinal: Denied gastro-esophageal reflux, melena, nausea and vomiting.  Genitourinary:*** Denied genitourinary symptoms including symptomatic vaginal discharge, pelvic relaxation issues, and urinary complaints.  Musculoskeletal: Denied musculoskeletal symptoms, stiffness, swelling, muscle weakness and myalgia.  Dermatologic: Denied dermatology symptoms, rash and scar.  Neurologic: Denied neurology symptoms, dizziness, headache, neck pain and syncope.  Psychiatric: Denied psychiatric symptoms, anxiety and depression.  Endocrine: Denied endocrine symptoms including hot flashes and night sweats.     OBJECTIVE: Initial Physical  Exam (New OB)  GENERAL APPEARANCE: {appearance:314449::alert, well appearing} HEAD: {head:313264::normocephalic, atraumatic} MOUTH: {pe mouth simple ob:314450::mucous membranes moist, pharynx normal without lesions} THYROID : {pe neck ob:312768::no thyromegaly or masses present} BREASTS: {pe breast simple:312769::no masses noted, no significant tenderness, no palpable axillary nodes, no skin changes} LUNGS: {pe lungs ob:314451::clear to auscultation, no wheezes, rales or rhonchi, symmetric air entry} HEART: {pe heart brief:310035::regular rate and rhythm,no murmurs} ABDOMEN: {pe abdomen pregnant simple ob:313266::soft, nontender, nondistended, no abnormal masses, no epigastric pain} EXTREMITIES: {pe extremities  ob:314458::no redness or tenderness in the calves or thighs} SKIN: {pe skin brief ob:314459::normal coloration and turgor, no rashes} LYMPH NODES: {pe lymph nodes brief:314538::no adenopathy palpable} NEUROLOGIC: {pe neurologic exam ob:312789::alert, oriented, normal speech, no focal findings or movement disorder noted}  PELVIC EXAM {pe pelvic exam prenatal obgyn:314539}  ASSESSMENT: {pregnancy state assessment:313271::Normal pregnancy}   PLAN: Routine prenatal care. We discussed an overview of prenatal care and when to call. Reviewed diet, exercise, and weight gain recommendations in pregnancy. Discussed benefits of breastfeeding and lactation resources at [facility]. I reviewed labs and answered all questions.  1. Antenatal screening encounter - NOB Panel; Future - Culture, OB Urine - Monitor Drug Profile 14(MW) - Nicotine screen, urine - Urinalysis, Routine w reflex microscopic - MaterniT 21 plus Core, Blood - Cytology - PAP - Hgb Fractionation Cascade - NOB Panel  2. Supervision of other normal pregnancy, antepartum (Primary) - NOB Panel; Future - Culture, OB Urine - Monitor Drug Profile 14(MW) - Nicotine screen, urine - Urinalysis, Routine w reflex microscopic - MaterniT 21 plus Core, Blood - Cytology - PAP - Hgb Fractionation Cascade - NOB Panel  3. Genetic screening - NOB Panel; Future - MaterniT 21 plus Core, Blood - NOB Panel  4. Screening for human immunodeficiency virus - NOB Panel; Future - NOB Panel  5. Cervical cancer screening - Cytology - PAP  6. Screen for sexually transmitted diseases - NOB Panel; Future - NOB Panel  7. Immunity status testing - NOB Panel; Future - NOB Panel   Forestine Igo, CNM

## 2024-03-06 NOTE — Patient Instructions (Signed)
 First Trimester of Pregnancy  The first trimester of pregnancy starts on the first day of your last monthly period until the end of week 13. This is months 1 through 3 of pregnancy. A week after a sperm fertilizes an egg, the egg will implant into the wall of the uterus and begin to develop into a baby. Body changes during your first trimester Your body goes through many changes during pregnancy. The changes usually return to normal after your baby is born. Physical changes Your breasts may grow larger and may hurt. The area around your nipples may get darker. Your periods will stop. Your hair and nails may grow faster. You may pee more often. Health changes You may tire easily. Your gums may bleed and may be sensitive when you brush and floss. You may not feel hungry. You may have heartburn. You may throw up or feel like you may throw up. You may want to eat some foods, but not others. You may have headaches. You may have trouble pooping (constipation). Other changes Your emotions may change from day to day. You may have more dreams. Follow these instructions at home: Medicines Talk to your health care provider if you're taking medicines. Ask if the medicines are safe to take during pregnancy. Your provider may change the medicines that you take. Do not take any medicines unless told to by your provider. Take a prenatal vitamin that has at least 600 micrograms (mcg) of folic acid. Do not use herbal medicines, illegal substances, or medicines that are not approved by your provider. Eating and drinking While you're pregnant your body needs extra food for your growing baby. Talk with your provider about what to eat while pregnant. Activity Most women are able to exercise during pregnancy. Exercises may need to change as your pregnancy goes on. Talk to your provider about your activities and exercise routines. Relieving pain and discomfort Wear a good, supportive bra if your breasts  hurt. Rest with your legs raised if you have leg cramps or low back pain. Safety Wear your seatbelt at all times when you're in a car. Talk to your provider if someone hits you, hurts you, or yells at you. Talk with your provider if you're feeling sad or have thoughts of hurting yourself. Lifestyle Certain things can be harmful while you're pregnant. Follow these rules: Do not use hot tubs, steam rooms, or saunas. Do not douche. Do not use tampons or scented pads. Do not drink alcohol,smoke, vape, or use products with nicotine or tobacco in them. If you need help quitting, talk with your provider. Avoid cat litter boxes and soil used by cats. These things carry germs that can cause harm to your pregnancy and your baby. General instructions Keep all follow-up visits. It helps you and your unborn baby stay as healthy as possible. Write down your questions. Take them to your visits. Your provider will: Talk with you about your overall health. Give you advice or refer you to specialists who can help with different needs, including: Prenatal education classes. Mental health and counseling. Foods and healthy eating. Ask for help if you need help with food. Call your dentist and ask to be seen. Brush your teeth with a soft toothbrush. Floss gently. Where to find more information American Pregnancy Association: americanpregnancy.org [employer]: acog.org Office on [employer] Health: TravelLesson.ca Contact a health care provider if: You feel dizzy, faint, or have a fever. You vomit or have watery poop (diarrhea) for 2  days or more. You have abnormal discharge or bleeding from your vagina. You have pain when you pee or your pee smells bad. You have cramps, pain, or pressure in your belly area. Get help right away if: You have trouble breathing or chest pain. You have any kind of injury, such as from a fall or a car crash. These symptoms may be an  emergency. Get help right away. Call 911. Do not wait to see if the symptoms will go away. Do not drive yourself to the hospital. This information is not intended to replace advice given to you by your health care provider. Make sure you discuss any questions you have with your health care provider. Document Revised: 06/06/2023 Document Reviewed: 01/04/2023 Elsevier Patient Education  2024 [employer]. [employer] Gain During Pregnancy Gaining some weight during pregnancy is normal and healthy. The amount of weight you should gain depends on your health and weight before pregnancy. Talk with your health care provider to find out the right amount of weight gain for you. The suggested weight gain is based on your body mass index, or BMI. Here's a general guide based on your weight before pregnancy: If you're underweight or have a BMI less than 18.5, you should gain 28-40 lb (13-18 kg). If you're at a normal weight with a BMI of 18.5-24.9, you should gain 25-35 lb (11-16 kg). If you're overweight with a BMI of 25-29.9, you should gain 15-25 lb (7-11 kg). If you're obese with a BMI of 30 or higher, you should gain 11-20 lb (5-9 kg). Your provider may suggest that you try to gain weight slower or gain more weight based on what's best for your baby and your health. What are the risks of unhealthy weight gain for me? Gaining too much weight during pregnancy can lead to: A type of diabetes that can happen during pregnancy. This is called gestational diabetes. Hypertensive disorders of pregnancy. These are problems that cause high blood pressure. Having a difficult delivery or a C-section. What are the risks of unhealthy weight gain for my baby? Gaining too little weight during pregnancy can lead to: Loss of the pregnancy. An early (preterm) birth. Your baby not growing normally. Your baby having a low weight at birth. Gaining too much weight during pregnancy can lead to: Your baby growing  larger than normal during pregnancy. Your baby having an increased risk of obesity. What actions can I take to gain a healthy amount of weight during pregnancy? Nutrition  Eat healthy foods. Every day, try to eat: Fruits and vegetables of various colors and kinds. Whole grains, like whole-wheat breads and oatmeal. Low-fat dairy foods such as yogurt, milk, and cheese. Or try non-dairy alternatives from soy or almond. Protein-rich foods, like lean meat, chicken, eggs, and beans. Avoid foods that are fried or have a lot of fat, salt, or sugar. Drink more fluids as told. Choose healthy snacks and drinks: Drink water. Avoid soda, sports drinks, and juices that have added sugar. Avoid drinks with caffeine, such as coffee and energy drinks. Eat snacks that are high in protein, such as nuts, protein bars, and low-fat yogurt. Carry snacks with you that don't need refrigeration, such as trail mix, an apple, or a bar. If you need help improving your diet, work with your provider or an expert in healthy eating called a dietitian. Activity  Exercise regularly, as told by your provider. If you were active before you became pregnant, you may be able to continue your  regular fitness activities. If you were not active before pregnancy, you may slowly build up to exercising for 30 or more minutes on most days of the week. This may include walking, swimming, or yoga. Ask your provider what activities are safe for you. Follow these instructions at home: Take medicines only as told. Take all prenatal supplements as told. Keep track of your weight gain during pregnancy. Keep all prenatal health care visits. These visits are a good time to discuss your weight gain. Your provider will check on your health and the health of your baby. Where to find support If you have questions or need help learning about healthy weight gain during pregnancy, these people may help: Your health care provider. A  dietitian. Where to find more information To learn more: Go to bitchilla.com. Click Search and type weight gain. Find the link you need. Contact a health care provider if: You're not able to eat or drink for longer than 24 hours. You can't afford food or have trouble getting regular meals. This information is not intended to replace advice given to you by your health care provider. Make sure you discuss any questions you have with your health care provider. Document Revised: 07/26/2023 Document Reviewed: 07/26/2023 Elsevier Patient Education  2025 [employer]. Genetic Testing During Pregnancy: What to Know Genetic testing is done when you're pregnant to check if your baby might have a congenital condition. A congenital condition is something a baby is born with, also called a birth condition. These conditions can happen when genes or chromosomes are not normal. Genes are tiny parts in your body that make up chromosomes. Chromosomes are groups of many genes. Together, they tell your body how to look and work. Genes are passed down from parents to their baby. Why is genetic testing done during pregnancy? Genetic testing allows you to: Talk about your test results and future plans with your health care team. Plan for a baby that may be born with a congenital condition. Make plans with your health care team in case your baby needs special care before or after birth. Think about your options regarding whether you want to continue with the pregnancy. Types of genetic tests A genetic test can be a screening test or a diagnostic test. Screening tests     Screening tests are used to check the risk of your baby having a congenital condition. They don't show if your baby actually has the condition. More testing will be needed to know for sure. Screening tests are recommended for all pregnant people. Screening tests will not hurt your baby. Types of screening tests include: Carrier  screening. The parents' blood or saliva is tested to check for genes that aren't normal. These genes can be passed to the baby. If both parents have the gene, the baby is at risk. First-trimester screening. This includes a maternal blood test and an ultrasound of your baby. This test checks for a risk of conditions related to chromosomes. It also looks for problems with your baby's heart, belly, or bones. Second-trimester screening. This may include a maternal blood test and an ultrasound of your baby. This test checks for the risk of conditions related to chromosomes. It also looks for problems with many parts of your baby's body. These include the brain, nose, mouth, spine, heart, and arms or legs. Some people may only have an ultrasound and not have a blood test. Combined or sequential screening. This looks at the results from the blood tests in the  first and second trimesters, along with the findings of the first-trimester ultrasound. It helps to tell you more about your baby. This type of testing may be more accurate than just doing screening in the first or second trimester by itself. Cell-free DNA testing. During pregnancy, cells from your placenta get into your blood, which is normal. This test is a blood test that looks at those cells. It's done after 10 weeks of pregnancy. It can be used to check for the risk of conditions caused by having too many chromosomes or an abnormal number of sex chromosomes.  Diagnostic tests Diagnostic tests are done only if your baby is known to be at risk of having a congenital condition. These tests check the cells from your baby to diagnose a condition. Examples of these tests include: Chorionic villus sampling (CVS). This is a procedure where cells are taken from the placenta for testing. To do this, a needle is put into your belly using guided ultrasound. Amniocentesis. This is a procedure where amniotic fluid is removed from the sac around your baby. The cells  from the placenta or amniotic fluid are tested for chromosomes that are not normal. What do the results mean? For a screening test: If your results are negative, it means that your baby is most likely not at a higher risk for a condition. There's still a small chance your baby could have a condition. If your results are positive, it means that your baby's risk for a condition is higher than normal. Your health care provider may want you to have a diagnostic test. For a diagnostic test: If the result is negative, it's not likely that your baby will have a condition. If the test is positive, your baby most likely has a condition. Talk with your provider about what your results mean and what your options are. Questions to ask your health care provider Talk with your provider about the conditions that run in your family. Ask these questions: Is my baby at risk for a congenital condition? What are the benefits of having genetic screening? Should I meet with a genetic counselor? Should my partner or other members of my family be tested? What tests are best for me and my baby? How much do the tests cost? Will my insurance cover the testing? What are the risks of each test? This information is not intended to replace advice given to you by your health care provider. Make sure you discuss any questions you have with your health care provider. Document Revised: 07/25/2023 Document Reviewed: 07/25/2023 Elsevier Patient Education  2025 [employer]. Pregnancy: Healthy Eating While you're pregnant, your body needs extra nutrition for your growing baby. You also need more vitamins and minerals, such as folic acid, calcium, iron, and vitamin D. Eating a balanced diet is important for both you and your baby. Your need for extra calories will change during pregnancy. During the first 3 months of pregnancy, called the first trimester, you don't need more calories. During the second trimester, you'll need  about 340 extra calories a day. During the third trimester, you'll need about 450 extra calories a day. If you're carrying more than one baby, talk with your health care provider or a dietitian to learn more about your specific eating needs. What are tips for eating healthy during pregnancy? Meal planning  Eating smaller meals throughout the day may help manage some side effects common in pregnancy, like heartburn and reflux. Eat a variety of foods. Be sure to include  many types of fruits and vegetables. Two or more servings of fish are recommended each week. Choose fish that are lower in mercury, such as salmon and pollock. Limit foods that have empty calories. These are foods that have little nutritional value, such as sweets, desserts, candies, and drinks with sugar in them. Drinks that have caffeine are OK to drink, but it's better to avoid caffeine. Limit your total caffeine intake to less than 200 mg each day, or the limit you're told by your provider. Be aware that 200 mg of caffeine is 12 oz or 355 mL of coffee, tea, or soda. General information Take a prenatal vitamin to help meet your vitamin and mineral needs during pregnancy. This includes your need for folic acid, iron, calcium, and vitamin D. Do not try to lose weight or go on a diet during pregnancy. Food safety  Wash your hands before you eat and after you prepare raw meat. Wash all fruits and vegetables well before peeling or eating. Make sure that all meats, poultry, and eggs are cooked to food-safe temperatures or well-done. Taking these actions can help keep your food safe and protect you and your baby from dangerous food illnesses. Ask your provider for more information. What foods should I eat? Fruits All fruits. Eat a variety of colors and types of fruit. Remember to wash your fruits well before peeling or eating. Vegetables All vegetables. Eat a variety of colors and types of vegetables. Remember to wash your  vegetables well before peeling or eating. Grains All grains. Choose whole grains, such as whole-wheat bread, oatmeal, or brown rice. Meats and other protein foods Lean meats, including chicken, [place], and lean cuts of beef, veal, or pork. Fish that is higher in omega-3 fatty acids and lower in mercury, such as salmon, herring, mussels, trout, sardines, pollock, shrimp, crab, and lobster. Tofu. Tempeh. Beans. Eggs. Peanut butter and other nut butters. Dairy Pasteurized milk and milk alternatives, such as soy milk. Pasteurized yogurt and pasteurized cheese. Cottage cheese. Sour cream. Beverages Water. Juices that contain 100% fruit juice or vegetable juice. Caffeine-free teas and decaffeinated coffee. Fats and oils Fats and oils are OK to include in moderation. Sweets and desserts Sweets and desserts are OK to include in moderation. Seasoning and other foods All pasteurized condiments. The items listed above may not be all the foods and drinks you can have. Talk with a dietitian to learn more. What does 340 extra calories look like? Healthy snacks that give you 340 more calories a day could be: Peanut butter and jelly with milk: 8 oz (237 mL) of low-fat milk. Peanut butter and jelly sandwich made with: 1 slice of whole-wheat bread. 2 teaspoons (10 g) of peanut butter. Yogurt and berries: 1 cup (245 g) of [place] yogurt. 1 cup (150 g) of berries. 2 tablespoons (30 g) of chopped nuts, such as almonds or walnuts. Avocado toast: 1 slice of whole-wheat bread. 1/2 medium avocado (70 g). 1 large egg (50 g). What foods should I avoid? Fruits Raw (unpasteurized) fruit juices. Vegetables Unpasteurized vegetable juices. Meats and other protein foods Precooked or cured meat, such as bologna, hot dogs, sausages, or meat loaves. (If you must eat those meats, reheat them until they are steaming hot.) Refrigerated pate, meat spreads from a meat counter, or smoked seafood that's found in the  refrigerated section of a store. Raw or undercooked meats, poultry, and eggs. Raw fish, such as sushi or sashimi. Fish that have high mercury content, such as tilefish,  shark, swordfish, and king mackerel. Dairy Unpasteurized or raw milk and any foods that are made from them. Some of these may be: Homemade yogurts or puddings. Soft cheeses such as: Feta. Queso blanco or fresco. Pharmacist, hospital or [place]. Blue-veined cheeses. Some of these types of cheeses may be made with pasteurized milk. Check the label. If pasteurized milk is used, they are OK to eat during pregnancy. Deli foods Premade foods from a store or deli, like chicken salad, coleslaw, or egg salad. These are riskier for food illness than fresh or homemade salads. Beverages Alcohol. Sugar-sweetened drinks, such as sodas or teas. Energy drinks. Seasoning and other foods Homemade fermented foods and drinks, such as: Pickles. Sauerkraut. Kombucha. Store-bought pasteurized versions of these are OK. The items listed above may not be all the foods and drinks you should avoid. Talk with a dietitian to learn more. Where to find more information To learn more, go to: Centers for Disease Control and Prevention at TonerPromos.no. Click Search and type food choices for pregnancy. Find the link you need. MyPlate at http://pittman-dennis.biz/. This information is not intended to replace advice given to you by your health care provider. Make sure you discuss any questions you have with your health care provider. Document Revised: 08/21/2023 Document Reviewed: 08/21/2023 Elsevier Patient Education  2025 [employer]. Second Trimester of Pregnancy  The second trimester of pregnancy is from week 14 through week 27. This is months 4 through 6 of pregnancy. During the second trimester: Morning sickness is less or has stopped. You may have more energy. You may feel hungry more often. At this time, your unborn baby is growing very fast. At the end  of the sixth month, the unborn baby may be up to 12 inches long and weigh about 1 pounds. You will likely start to feel the baby move between 16 and 20 weeks of pregnancy. Body changes during your second trimester Your body continues to change during this time. The changes usually go away after your baby is born. Physical changes You will gain more weight. Your belly will get bigger. You may begin to get stretch marks on your hips, belly, and breasts. Your breasts will keep growing and may hurt. You may get dark spots or blotches on your face. A dark line from your belly button to the pubic area may appear. This line is called linea nigra. Your hair may grow faster and get thicker. Health changes You may have headaches. You may have heartburn. You may pee more often. You may have swollen, bulging veins (varicose veins). You may have trouble pooping (constipation), or swollen veins in the butt that can itch or get painful (hemorrhoids). You may have back pain. This is caused by: Weight gain. Pregnancy hormones that are relaxing the joints in your pelvis. Follow these instructions at home: Medicines Talk to your health care provider if you're taking medicines. Ask if the medicines are safe to take during pregnancy. Your provider may change the medicines that you take. Do not take any medicines unless told to by your provider. Take a prenatal vitamin that has at least 600 micrograms (mcg) of folic acid. Do not use herbal medicines, illegal drugs, or medicines that are not approved by your provider. Eating and drinking While you're pregnant your body needs extra food for your growing baby. Talk with your provider about what to eat while pregnant. Activity Most women are able to exercise during pregnancy. Exercises may need to change as your pregnancy goes on. Talk to  your provider about your activities and exercise routines. Relieving pain and discomfort Wear a good, supportive bra if  your breasts hurt. Rest with your legs raised if you have leg cramps or low back pain. Take warm sitz baths to soothe pain from hemorrhoids. Use hemorrhoid cream if your provider says it's okay. Do not douche. Do not use tampons or scented pads. Do not use hot tubs, steam rooms, or saunas. Safety Wear your seatbelt at all times when you're in a car. Talk to your provider if someone hits you, hurts you, or yells at you. Talk with your provider if you're feeling sad or have thoughts of hurting yourself. Lifestyle Certain things can be harmful while you're pregnant. It's best to avoid the following: Do not drink alcohol,smoke, vape, or use products with nicotine or tobacco in them. If you need help quitting, talk with your provider. Avoid cat litter boxes and soil used by cats. These things carry germs that can cause harm to your pregnancy and your baby. General instructions Keep all follow-up visits. It helps you and your unborn baby stay as healthy as possible. Write down your questions. Take them to your prenatal visits. Your provider will: Talk with you about your overall health. Give you advice or refer you to specialists who can help with different needs, including: Prenatal education classes. Mental health and counseling. Foods and healthy eating. Ask for help if you need help with food. Where to find more information American Pregnancy Association: americanpregnancy.org [employer]: acog.org Office on [employer] Health: TravelLesson.ca Contact a health care provider if: You have a headache that does not go away when you take medicine. You have any of these problems: You can't eat or drink. You throw up or feel like you may throw up. You have watery poop (diarrhea) for 2 days or more. You have pain when you pee or your pee smells bad. You have been sick for 2 days or more and are not getting better. Contact your provider right away if: You  have any of these coming from your vagina: Abnormal discharge. Bad-smelling fluid. Bleeding. Your baby is moving less than usual. You have contractions, belly cramping, or have pain in your pelvis or lower back. You have symptoms of high blood pressure or preeclampsia. These include: A severe, throbbing headache that does not go away. Sudden or extreme swelling of your face, hands, legs, or feet. Vision problems: You see spots. You have blurry vision. Your eyes are sensitive to light. If you can't reach the provider, go to an urgent care or emergency room. Get help right away if: You faint, become confused, or can't think clearly. You have chest pain or trouble breathing. You have any kind of injury, such as from a fall or a car crash. These symptoms may be an emergency. Call 911 right away. Do not wait to see if the symptoms will go away. Do not drive yourself to the hospital. This information is not intended to replace advice given to you by your health care provider. Make sure you discuss any questions you have with your health care provider. Document Revised: 06/06/2023 Document Reviewed: 01/04/2023 Elsevier Patient Education  2024 Elsevier Inc. Benefits of Breastfeeding Breastfeeding has many health benefits for babies and their breastfeeding parents. Breast milk is the best form of nutrition for babies. Breastfeeding is recommended until a child is 68 years old or older, as long as this is wanted by both the breastfeeding parent and child. Breastfeeding  can be challenging, especially during the first few weeks after your baby is born. Ask your health care provider or breastfeeding specialist (lactation consultant) for more information and help if needed. Asking for help early can provide the support you need for successful breastfeeding. Breastfeeding is not the only way to feed your baby breast milk. Some parents choose not to feed their babies directly from the breast. Instead,  they pump and bottle-feed their babies expressed breast milk. How do breastfeeding and pumping benefit me? Breastfeeding and pumping may: Help to create a bond between you and your baby. Slow bleeding after you give birth. Help your uterus return to its size before pregnancy. Help you lose the weight gained during pregnancy. Lower your risk of developing type 2 diabetes, weak bones (osteoporosis), rheumatoid arthritis, cardiovascular disease, and some forms of cancer later in life. How does breast milk benefit my baby? Nutrients in breast milk are better for your baby compared to nutrients in infant formula. Breast milk is easier for your baby to digest. The first milk (colostrum)helps your baby's digestive system function better. Breast milk lowers the risk of problems with the stomach and intestines. This includes necrotizing enterocolitis (NEC) in newborns. NEC is the inflammation and death of tissue in the intestine. Babies born early (premature) are at a higher risk of developing NEC. As your baby grows, your body changes the nutrients in your breast milk to meet your baby's needs. Breast milk improves your baby's brain development. Breast milk lowers your baby's risk for sudden infant death syndrome (SIDS). Breast milk can lower your baby's risk of: Ear infections. Infections of the nose, throat, or airways (respiratory infections). Allergies. Obesity. Diabetes. What are breast milk antibodies? Breast milk antibodies are substances that are part of the body's disease-fighting system (immune system). Antibodies help your baby fight off infections. Breast milk antibodies protect your baby from illnesses that you have: Had yourself. Been vaccinated against. Been exposed to while breastfeeding or pumping. What are other benefits of breastfeeding and bottle-feeding expressed breast milk? Breast milk is free. Breastfeeding may be convenient. In an emergency situation, formula shortage,  or natural disaster, you will be able to feed your baby without the need for safe water or infant formula. If you are exclusively pumping, manual breast pumps are available to express breast milk. You will need to boil pump parts to keep them clean between uses. If you do not have access to clean water or a manual breast pump, you can hand express your milk. Do this by compressing and massaging the milk ducts of each breast and catching the breast milk in a bottle or storage container. Where to find more information [employer]: llli.org Breastfeeding, Centers for Disease Control and Prevention (CDC): TonerPromos.no Breastfeeding and Returning to the Workplace, Marine scientist for Disease Control and Prevention (CDC): TonerPromos.no [employer] (ACOG) : acog.org WIC Breastfeeding Support: wicbreastfeeding.fns.usda.gov Contact a health care provider if: You are frustrated with breastfeeding. You are worried your baby is not getting enough milk. Signs of not getting enough milk include: Your baby is not gaining weight or loses weight. Your baby is more than 83 week old and wetting fewer than 6 diapers in a 24-hour period. You do not hear your baby swallowing during feeds. Your full breasts do not get softer after your baby breastfeeds. Your baby is frequently too sleepy to feed well or is crying and will not stop. You have pain or discomfort in your breasts such  as: Continued pain with breastfeeding. Breast engorgement that does not improve after 48-72 hours. Cracking or soreness in your nipples that does not get better with treatment. Bleeding from your nipples. This information is not intended to replace advice given to you by your health care provider. Make sure you discuss any questions you have with your health care provider. Document Revised: 09/20/2022 Document Reviewed: 08/24/2022 Elsevier Patient Education  2024 Elsevier Inc. Problems to Watch for  During Pregnancy During pregnancy, your body goes through many changes. Some changes may be uncomfortable. But most changes are not a serious problem. It's important to learn when certain signs and symptoms may be a problem. Talk with your health care provider about any medical conditions you have. Make sure you know the symptoms to watch for. Reporting problems early will prevent complications. Problems to watch for during pregnancy You're more likely to get an infection during pregnancy. Let your provider know if you have signs of infection, such as: A fever. A bad-smelling fluid from your vagina. Peeing too often, wanting to pee urgently, or pain when you pee. Also, let your provider know if: You're very tired, you feel dizzy, or you faint. You have watery poop (diarrhea) for 24 hours or longer. You throw up or feel like throwing up for 24 hours or longer. You have cramping in your belly or have pain in your hips or lower back. You have spotting, bleeding, or leaking of fluid from your vagina. You have pain, swelling, or redness in an arm or leg. You should also watch for signs of high blood pressure and preeclampsia. These signs can be very serious. They include: A headache that doesn't go away when you take medicine. Sudden or very bad swelling of your face, hands, legs, or feet. Problems seeing, such as: You see spots. You have blurry vision. You may be sensitive to light. Why it's important to watch for these problems Watching and reporting problems to your provider can help prevent complications that may affect you and your baby. These include: Higher risk of giving birth early. Infection that may be passed on to your baby. Higher risk for stillbirth. Follow these instructions at home:  Take your medicines only as told. Keep all follow-up visits. Your provider needs to monitor your health and your baby's health. Where to find more information To learn more, go to these  websites: Centers for Disease Control and Prevention (CDC) at TonerPromos.no. Then: Click Health Topics A-Z. Type urgent maternal warning signs in the search box. [employer] (ACOG): acog.org Contact a health care provider if: You have any problems while you're pregnant. You feel your baby moving less than usual. You have any of these things: You have strong emotions, such as sadness or anxiety, that affect your daily life. You do not feel safe in your home. You use tobacco, alcohol, or drugs, and you need help to stop. Get help right away if: You faint, have a seizure, or cannot think clearly. You have chest pain or difficulty breathing. You have any of the following symptoms and you were unable to reach your provider: You have symptoms of infection, including a fever, or have vaginal bleeding. You have symptoms of high blood pressure or preeclampsia. You have signs or symptoms of labor before 37 weeks of pregnancy. These include: Contractions that are 5 minutes or less apart, or that increase in frequency, intensity, or length. Sudden, sharp pain in the belly, or low back pain. Any amount  of fluid that flows from your vagina without stopping. These symptoms may be an emergency. Call 911 right away. Do not wait to see if the symptoms will go away. Do not drive yourself to the hospital. This information is not intended to replace advice given to you by your health care provider. Make sure you discuss any questions you have with your health care provider. Document Revised: 02/12/2023 Document Reviewed: 02/12/2023 Elsevier Patient Education  2024 [employer].

## 2024-03-07 LAB — URINALYSIS, ROUTINE W REFLEX MICROSCOPIC
Bilirubin, UA: NEGATIVE
Glucose, UA: NEGATIVE
Nitrite, UA: NEGATIVE
RBC, UA: NEGATIVE
Specific Gravity, UA: >=1.03 — AB (ref 1.005–1.030)
Urobilinogen, Ur: 0.2 mg/dL (ref 0.2–1.0)
pH, UA: 6 (ref 5.0–7.5)

## 2024-03-07 LAB — MICROSCOPIC EXAMINATION
Casts: NONE SEEN /LPF
RBC, Urine: NONE SEEN /HPF (ref 0–2)

## 2024-03-08 LAB — URINE CULTURE, OB REFLEX

## 2024-03-08 LAB — CULTURE, OB URINE

## 2024-03-10 LAB — HGB FRACTIONATION CASCADE
Hgb A2: 2.8 % (ref 1.8–3.2)
Hgb A: 97.2 % (ref 96.4–98.8)
Hgb F: 0 % (ref 0.0–2.0)
Hgb S: 0 %

## 2024-03-10 LAB — CBC/D/PLT+RPR+RH+ABO+RUBIGG...
Antibody Screen: NEGATIVE
Basophils Absolute: 0 10*3/uL (ref 0.0–0.2)
Basos: 0 %
EOS (ABSOLUTE): 0 10*3/uL (ref 0.0–0.4)
Eos: 0 %
HCV Ab: NONREACTIVE
HIV Screen 4th Generation wRfx: NONREACTIVE
Hematocrit: 38.1 % (ref 34.0–46.6)
Hemoglobin: 12.7 g/dL (ref 11.1–15.9)
Hepatitis B Surface Ag: NEGATIVE
Immature Grans (Abs): 0 10*3/uL (ref 0.0–0.1)
Immature Granulocytes: 0 %
Lymphocytes Absolute: 2.8 10*3/uL (ref 0.7–3.1)
Lymphs: 28 %
MCH: 29.5 pg (ref 26.6–33.0)
MCHC: 33.3 g/dL (ref 31.5–35.7)
MCV: 89 fL (ref 79–97)
Monocytes Absolute: 0.8 10*3/uL (ref 0.1–0.9)
Monocytes: 8 %
Neutrophils Absolute: 6.3 10*3/uL (ref 1.4–7.0)
Neutrophils: 64 %
Platelets: 279 10*3/uL (ref 150–450)
RBC: 4.3 x10E6/uL (ref 3.77–5.28)
RDW: 12.8 % (ref 11.7–15.4)
RPR Ser Ql: NONREACTIVE
Rh Factor: POSITIVE
Rubella Antibodies, IGG: 1.93 {index} (ref >0.99)
Varicella zoster IgG: REACTIVE
WBC: 10 10*3/uL (ref 3.4–10.8)

## 2024-03-10 LAB — CYTOLOGY - PAP
Chlamydia: NEGATIVE
Comment: NEGATIVE
Comment: NORMAL
Diagnosis: NEGATIVE
Neisseria Gonorrhea: NEGATIVE

## 2024-03-10 LAB — HCV INTERPRETATION

## 2024-03-12 ENCOUNTER — Ambulatory Visit: Payer: Self-pay | Admitting: Certified Nurse Midwife

## 2024-03-13 LAB — MONITOR DRUG PROFILE 14(MW)

## 2024-03-13 LAB — MATERNIT 21 PLUS CORE, BLOOD
Fetal Fraction: 11
Result (T21): NEGATIVE
Trisomy 13 (Patau syndrome): NEGATIVE
Trisomy 18 (Edwards syndrome): NEGATIVE
Trisomy 21 (Down syndrome): NEGATIVE

## 2024-03-13 LAB — NICOTINE SCREEN, URINE

## 2024-04-01 NOTE — Progress Notes (Unsigned)
     Return Prenatal Note   Subjective   28 y.o. G3P1011 at [redacted]w[redacted]d presents for this follow-up prenatal visit.  Patient some insomnia in the middle of the night Patient reports:   Movement: Present Contractions: Not present  Objective   Flow sheet Vitals: Pulse Rate: 92 BP: 116/69 Fundal Height: 15 cm Fetal Heart Rate (bpm): 148 Total weight gain: 10 lb 12.8 oz (4.899 kg)  General Appearance  No acute distress, well appearing, and well nourished Pulmonary   Normal work of breathing Neurologic   Alert and oriented to person, place, and time Psychiatric   Mood and affect within normal limits  Assessment/Plan   Plan  28 y.o. G3P1011 at [redacted]w[redacted]d presents for follow-up OB visit. Reviewed prenatal record including previous visit note.  Supervision of other normal pregnancy, antepartum Having some issues with waking up in the middle of the night and staying awake for a few hours. Reviewed trying a high protein snack before bed and increasing magnesium to 1000mg .  Anatomy US  ordered today Reviewed red flag warning signs anticipatory guidance for upcoming prenatal care.        No orders of the defined types were placed in this encounter.  No follow-ups on file.   Future Appointments  Date Time Provider Department Center  04/23/2024  8:15 AM [facility] US  1 AOB-IMG None     For next visit:  continue with routine prenatal care     Damien PARSLEY, CNM  07/18/258:35 AM

## 2024-04-03 ENCOUNTER — Ambulatory Visit (INDEPENDENT_AMBULATORY_CARE_PROVIDER_SITE_OTHER): Admitting: Certified Nurse Midwife

## 2024-04-03 ENCOUNTER — Encounter: Payer: Self-pay | Admitting: Certified Nurse Midwife

## 2024-04-03 VITALS — BP 116/69 | HR 92 | Wt 150.8 lb

## 2024-04-03 DIAGNOSIS — Z3689 Encounter for other specified antenatal screening: Secondary | ICD-10-CM | POA: Diagnosis not present

## 2024-04-03 DIAGNOSIS — Z3480 Encounter for supervision of other normal pregnancy, unspecified trimester: Secondary | ICD-10-CM | POA: Diagnosis not present

## 2024-04-03 NOTE — Assessment & Plan Note (Signed)
 Having some issues with waking up in the middle of the night and staying awake for a few hours. Reviewed trying a high protein snack before bed and increasing magnesium to 1000mg .  Anatomy US  ordered today Reviewed red flag warning signs anticipatory guidance for upcoming prenatal care.

## 2024-04-23 ENCOUNTER — Other Ambulatory Visit

## 2024-04-30 NOTE — Addendum Note (Signed)
 Addended by: KIZZIE CAMELIA CROME on: 04/30/2024 09:27 AM   Modules accepted: Orders

## 2024-05-01 ENCOUNTER — Encounter: Admitting: Licensed Practical Nurse

## 2024-05-05 ENCOUNTER — Ambulatory Visit (INDEPENDENT_AMBULATORY_CARE_PROVIDER_SITE_OTHER)

## 2024-05-05 ENCOUNTER — Ambulatory Visit (INDEPENDENT_AMBULATORY_CARE_PROVIDER_SITE_OTHER): Admitting: Licensed Practical Nurse

## 2024-05-05 ENCOUNTER — Encounter: Payer: Self-pay | Admitting: Licensed Practical Nurse

## 2024-05-05 VITALS — BP 129/81 | HR 81 | Wt 159.4 lb

## 2024-05-05 DIAGNOSIS — Z3A20 20 weeks gestation of pregnancy: Secondary | ICD-10-CM | POA: Diagnosis not present

## 2024-05-05 DIAGNOSIS — Z3480 Encounter for supervision of other normal pregnancy, unspecified trimester: Secondary | ICD-10-CM | POA: Diagnosis not present

## 2024-05-05 DIAGNOSIS — Z3482 Encounter for supervision of other normal pregnancy, second trimester: Secondary | ICD-10-CM

## 2024-05-05 DIAGNOSIS — Z3689 Encounter for other specified antenatal screening: Secondary | ICD-10-CM

## 2024-05-05 NOTE — Progress Notes (Signed)
    Return Prenatal Note   Subjective   28 y.o. G3P1011 at [redacted]w[redacted]d presents for this follow-up prenatal visit.  Patient Here with here husband, [name] Patient reports:Feeling good, notices changes in her breasts. No concerns today  -he has 3 kids ages 59,11, and 45, she has a 40 y/o, their family is complete, planning vasectomy   Movement: Present Contractions: Not present  Objective   Flow sheet Vitals: Pulse Rate: 81 BP: 129/81 Fetal Heart Rate (bpm): 140 Total weight gain: 19 lb 6.4 oz (8.8 kg)  General Appearance  No acute distress, well appearing, and well nourished Pulmonary   Normal work of breathing Neurologic   Alert and oriented to person, place, and time Psychiatric   Mood and affect within normal limits  Assessment/Plan   Plan  28 y.o. G3P1011 at [redacted]w[redacted]d presents for follow-up OB visit. Reviewed prenatal record including previous visit note.  Supervision of other normal pregnancy, antepartum -Anatomy US  today, normal findings -Drop in heart rate noted: while ausculting heart beat, 140's then drop/pause to 130's to 140's, listened for about 1-2 mins after no further drop/pause. Given normal anatomy US , offered to schedule with MFM or return sooner and if drop heard again then refer to MFM, pt prefers to wait.       No orders of the defined types were placed in this encounter.  Return in about 4 weeks (around 06/02/2024) for ROB.   Future Appointments  Date Time Provider Department Center  06/02/2024  8:15 AM Slaughterbeck, Damien, CNM AOB-AOB None    For next visit:  continue with routine prenatal care     JINNIE HERO [facility], CNM  08/19/253:31 PM

## 2024-05-05 NOTE — Assessment & Plan Note (Signed)
-  Anatomy US  today, normal findings -Drop in heart rate noted: while ausculting heart beat, 140's then drop/pause to 130's to 140's, listened for about 1-2 mins after no further drop/pause. Given normal anatomy US , offered to schedule with MFM or return sooner and if drop heard again then refer to MFM, pt prefers to wait.

## 2024-06-01 NOTE — Progress Notes (Addendum)
    Return Prenatal Note   Subjective   28 y.o. G3P1011 at [redacted]w[redacted]d presents for this follow-up prenatal visit.  Patient is doing well today. She has noticed that she has some edema in her legs during the day, but it resolves at night with elevation.  Patient reports: Movement: Present Contractions: Not present  Objective   Flow sheet Vitals: Pulse Rate: 90 BP: 116/64 Fundal Height: 24 cm Fetal Heart Rate (bpm): 146 Total weight gain: 29 lb 9.6 oz (13.4 kg)  General Appearance  No acute distress, well appearing, and well nourished Pulmonary   Normal work of breathing Neurologic   Alert and oriented to person, place, and time Psychiatric   Mood and affect within normal limits   Assessment/Plan   Plan  28 y.o. G3P1011 at [redacted]w[redacted]d presents for follow-up OB visit. Reviewed prenatal record including previous visit note.  Supervision of other normal pregnancy, antepartum Reviewed swelling in lower extremities with comfort measures given.  29lb weight gain so far this pregnancy. Braleigh remembers gaining much of her weight in the second trimester her last pregnancy. Reviewed healthy food choices and movement in pregnancy.  Reviewed red flag warning signs anticipatory guidance for upcoming prenatal care including 28 week labs.       Orders Placed This Encounter  Procedures   28 Week RH+Panel   No follow-ups on file.   Future Appointments  Date Time Provider Department Center  06/30/2024  8:20 AM AOB-OBGYN LAB A[facility]-AOB None  06/30/2024  8:55 AM Eitan Doubleday, Damien, CNM AOB-AOB None     For next visit:  ROB with 1 hour glucola, third trimester labs, and Tdap     Damien Parsley, CNM [facility] 09/22/254:27 PM

## 2024-06-02 ENCOUNTER — Encounter: Payer: Self-pay | Admitting: Certified Nurse Midwife

## 2024-06-02 ENCOUNTER — Ambulatory Visit (INDEPENDENT_AMBULATORY_CARE_PROVIDER_SITE_OTHER): Admitting: Certified Nurse Midwife

## 2024-06-02 VITALS — BP 116/64 | HR 90 | Wt 169.6 lb

## 2024-06-02 DIAGNOSIS — Z130 Encounter for screening for diseases of the blood and blood-forming organs and certain disorders involving the immune mechanism: Secondary | ICD-10-CM

## 2024-06-02 DIAGNOSIS — Z3482 Encounter for supervision of other normal pregnancy, second trimester: Secondary | ICD-10-CM | POA: Diagnosis not present

## 2024-06-02 DIAGNOSIS — Z131 Encounter for screening for diabetes mellitus: Secondary | ICD-10-CM

## 2024-06-02 DIAGNOSIS — Z3A24 24 weeks gestation of pregnancy: Secondary | ICD-10-CM | POA: Diagnosis not present

## 2024-06-02 DIAGNOSIS — Z113 Encounter for screening for infections with a predominantly sexual mode of transmission: Secondary | ICD-10-CM

## 2024-06-02 DIAGNOSIS — Z3480 Encounter for supervision of other normal pregnancy, unspecified trimester: Secondary | ICD-10-CM

## 2024-06-02 NOTE — Assessment & Plan Note (Signed)
 Reviewed swelling in lower extremities with comfort measures given.  29lb weight gain so far this pregnancy. Caroline Gonzalez remembers gaining much of her weight in the second trimester her last pregnancy. Reviewed healthy food choices and movement in pregnancy.  Reviewed red flag warning signs anticipatory guidance for upcoming prenatal care including 28 week labs.

## 2024-06-30 ENCOUNTER — Ambulatory Visit (INDEPENDENT_AMBULATORY_CARE_PROVIDER_SITE_OTHER): Admitting: Certified Nurse Midwife

## 2024-06-30 ENCOUNTER — Encounter: Payer: Self-pay | Admitting: Certified Nurse Midwife

## 2024-06-30 ENCOUNTER — Other Ambulatory Visit

## 2024-06-30 VITALS — BP 136/82 | HR 90 | Wt 178.5 lb

## 2024-06-30 DIAGNOSIS — Z23 Encounter for immunization: Secondary | ICD-10-CM | POA: Diagnosis not present

## 2024-06-30 DIAGNOSIS — Z3A28 28 weeks gestation of pregnancy: Secondary | ICD-10-CM

## 2024-06-30 DIAGNOSIS — Z3480 Encounter for supervision of other normal pregnancy, unspecified trimester: Secondary | ICD-10-CM

## 2024-06-30 DIAGNOSIS — Z3483 Encounter for supervision of other normal pregnancy, third trimester: Secondary | ICD-10-CM

## 2024-06-30 NOTE — Assessment & Plan Note (Signed)
 28 week labs drawn today. RhIG n/a RPR, CBC, HIV and 1-hour GTT sent Reviewed the importance of monitoring fetal movement as well as warning signs for pre-eclampsia and preterm labor.

## 2024-06-30 NOTE — Progress Notes (Signed)
    Return Prenatal Note   Subjective   28 y.o. G3P1011 at [redacted]w[redacted]d presents for this follow-up prenatal visit.  Patient is doing well. She reports good fetal movement. She has no new concerns. Patient reports: Movement: Present Contractions: Irritability  Objective   Flow sheet Vitals: Pulse Rate: 90 BP: 136/82 Fundal Height: 28 cm Fetal Heart Rate (bpm): 148 Total weight gain: 38 lb 8 oz (17.5 kg)  General Appearance  No acute distress, well appearing, and well nourished Pulmonary   Normal work of breathing Neurologic   Alert and oriented to person, place, and time Psychiatric   Mood and affect within normal limits   Assessment/Plan   Plan  28 y.o. G3P1011 at [redacted]w[redacted]d presents for follow-up OB visit. Reviewed prenatal record including previous visit note.  Supervision of other normal pregnancy, antepartum 28 week labs drawn today. RhIG n/a RPR, CBC, HIV and 1-hour GTT sent Reviewed the importance of monitoring fetal movement as well as warning signs for pre-eclampsia and preterm labor.       Orders Placed This Encounter  Procedures   Tdap vaccine greater than or equal to 7yo IM   Return in about 4 weeks (around 07/28/2024) for routine prenatal care.   Future Appointments  Date Time Provider Department Center  07/15/2024  2:35 PM Justino Eleanor HERO, CNM AOB-AOB None    For next visit:  continue with routine prenatal care     Damien Parsley, CNM [facility]

## 2024-06-30 NOTE — Patient Instructions (Signed)
 Third Trimester of Pregnancy  The third trimester of pregnancy is from week 28 through week 40. This is months 7 through 9. The third trimester is a time when your baby is growing fast. Body changes during your third trimester Your body continues to change during this time. The changes usually go away after your baby is born. Physical changes You will continue to gain weight. You may get stretch marks on your hips, belly, and breasts. Your breasts will keep growing and may hurt. A yellow fluid (colostrum) may leak from your breasts. This is the first milk you're making for your baby. Your hair may grow faster and get thicker. In some cases, you may get hair loss. Your belly button may stick out. You may have more swelling in your hands, face, or ankles. Health changes You may have heartburn. You may feel short of breath. This is caused by the uterus that is now bigger. You may have more aches in the pelvis, back, or thighs. You may have more tingling or numbness in your hands, arms, and legs. You may pee more often. You may have trouble pooping (constipation) or swollen veins in the butt that can itch or get painful (hemorrhoids). Other changes You may have more problems sleeping. You may notice the baby moving lower in your belly (dropping). You may have more fluid coming from your vagina. Your joints may feel loose, and you may have pain around your pelvic bone. Follow these instructions at home: Medicines Take medicines only as told by your health care provider. Some medicines are not safe during pregnancy. Your provider may change the medicines that you take. Do not take any medicines unless told to by your provider. Take a prenatal vitamin that has at least 600 micrograms (mcg) of folic acid. Do not use herbal medicines, illegal drugs, or medicines that are not approved by your provider. Eating and drinking While you're pregnant your body needs additional nutrition to help  support your growing baby. Talk with your provider about your nutritional needs. Activity Most women are able to exercise regularly during pregnancy. Exercise routines may need to change at the end of your pregnancy. Talk to your provider about your activities and exercise routine. Relieving pain and discomfort Rest often with your legs raised if you have leg cramps or low back pain. Take warm sitz baths to soothe pain from hemorrhoids. Use hemorrhoid cream if your provider says it's okay. Wear a good, supportive bra if your breasts hurt. Do not use hot tubs, steam rooms, or saunas. Do not douche. Do not use tampons or scented pads. Safety Talk to your provider before traveling far distances. Wear your seatbelt at all times when you're in a car. Talk to your provider if someone hits you, hurts you, or yells at you. Preparing for birth To prepare for your baby: Take childbirth and breastfeeding classes. Visit the hospital and tour the maternity area. Buy a rear-facing car seat. Learn how to install it in your car. General instructions Avoid cat litter boxes and soil used by cats. These things carry germs that can cause harm to your pregnancy and your baby. Do not drink alcohol, smoke, vape, or use products with nicotine or tobacco in them. If you need help quitting, talk with your provider. Keep all follow-up visits for your third trimester. Your provider will do more exams and tests during this trimester. Write down your questions. Take them to your prenatal visits. Your provider also will: Talk with you about  your overall health. Give you advice or refer you to specialists who can help with different needs, including: Mental health and counseling. Foods and healthy eating. Ask for help if you need help with food. Where to find more information American Pregnancy Association: americanpregnancy.org [employer]: acog.org Office on [employer] Health:  TravelLesson.ca Contact a health care provider if: You have a headache that does not go away when you take medicine. You have any of these problems: You can't eat or drink. You have nausea and vomiting. You have watery poop (diarrhea) for 2 days or more. You have pain when you pee, or your pee smells bad. You have been sick for 2 days or more and aren't getting better. Contact your provider right away if: You have any of these coming from your vagina: Abnormal discharge. Bad-smelling fluid. Bleeding. Your baby is moving less than usual. You have signs of labor: You have any contractions, belly cramping, or have pain in your pelvis or lower back before 37 weeks of pregnancy (preterm labor). You have regular contractions that are less than 5 minutes apart. Your water breaks. You have symptoms of high blood pressure or preeclampsia. These include: A severe, throbbing headache that does not go away. Sudden or extreme swelling of your face, hands, legs, or feet. Vision problems: You see spots. You have blurry vision. Your eyes are sensitive to light. If you can't reach your provider, go to an urgent care or emergency room. Get help right away if: You faint, become confused, or can't think clearly. You have chest pain or trouble breathing. You have any kind of injury, such as from a fall or a car crash. These symptoms may be an emergency. Call 911 right away. Do not wait to see if the symptoms will go away. Do not drive yourself to the hospital. This information is not intended to replace advice given to you by your health care provider. Make sure you discuss any questions you have with your health care provider. Document Revised: 06/06/2023 Document Reviewed: 01/04/2023 Elsevier Patient Education  2024 [employer].  Tdap (Tetanus, Diphtheria, Pertussis) Vaccine: What You Need to Know Many vaccine information statements are available in Spanish and other languages. See  PromoAge.com.br. 1. Why get vaccinated? Tdap vaccine can prevent tetanus, diphtheria, and pertussis. Diphtheria and pertussis spread from person to person. Tetanus enters the body through cuts or wounds. TETANUS (T) causes painful stiffening of the muscles. Tetanus can lead to serious health problems, including being unable to open the mouth, having trouble swallowing and breathing, or death. DIPHTHERIA (D) can lead to difficulty breathing, heart failure, paralysis, or death. PERTUSSIS (aP), also known as "whooping cough," can cause uncontrollable, violent coughing that makes it hard to breathe, eat, or drink. Pertussis can be extremely serious especially in babies and young children, causing pneumonia, convulsions, brain damage, or death. In teens and adults, it can cause weight loss, loss of bladder control, passing out, and rib fractures from severe coughing. 2. Tdap vaccine Tdap is only for children 7 years and older, adolescents, and adults.  Adolescents should receive a single dose of Tdap, preferably at age 23 or 12 years. Pregnant people should get a dose of Tdap during every pregnancy, preferably during the early part of the third trimester, to help protect the newborn from pertussis. Infants are most at risk for severe, life-threatening complications from pertussis. Adults who have never received Tdap should get a dose of Tdap. Also, adults should receive a booster  dose of either Tdap or Td (a different vaccine that protects against tetanus and diphtheria but not pertussis) every 10 years, or after 5 years in the case of a severe or dirty wound or burn. Tdap may be given at the same time as other vaccines. 3. Talk with your health care provider Tell your vaccine provider if the person getting the vaccine: Has had an allergic reaction after a previous dose of any vaccine that protects against tetanus, diphtheria, or pertussis, or has any severe, life-threatening allergies Has had a  coma, decreased level of consciousness, or prolonged seizures within 7 days after a previous dose of any pertussis vaccine (DTP, DTaP, or Tdap) Has seizures or another nervous system problem Has ever had Guillain-Barr Syndrome (also called "GBS") Has had severe pain or swelling after a previous dose of any vaccine that protects against tetanus or diphtheria In some cases, your health care provider may decide to postpone Tdap vaccination until a future visit. People with minor illnesses, such as a cold, may be vaccinated. People who are moderately or severely ill should usually wait until they recover before getting Tdap vaccine.  Your health care provider can give you more information. 4. Risks of a vaccine reaction Pain, redness, or swelling where the shot was given, mild fever, headache, feeling tired, and nausea, vomiting, diarrhea, or stomachache sometimes happen after Tdap vaccination. People sometimes faint after medical procedures, including vaccination. Tell your provider if you feel dizzy or have vision changes or ringing in the ears.  As with any medicine, there is a very remote chance of a vaccine causing a severe allergic reaction, other serious injury, or death. 5. What if there is a serious problem? An allergic reaction could occur after the vaccinated person leaves the clinic. If you see signs of a severe allergic reaction (hives, swelling of the face and throat, difficulty breathing, a fast heartbeat, dizziness, or weakness), call 9-1-1 and get the person to the nearest hospital. For other signs that concern you, call your health care provider.  Adverse reactions should be reported to the Vaccine Adverse Event Reporting System (VAERS). Your health care provider will usually file this report, or you can do it yourself. Visit the VAERS website at www.vaers.LAgents.no or call 937-175-7282. VAERS is only for reporting reactions, and VAERS staff members do not give medical advice. 6. The  National Vaccine Injury Compensation Program The [employer] Vaccine Injury Compensation Program (VICP) is a federal program that was created to compensate people who may have been injured by certain vaccines. Claims regarding alleged injury or death due to vaccination have a time limit for filing, which may be as short as two years. Visit the VICP website at SpiritualWord.at or call (925)586-3174 to learn about the program and about filing a claim. 7. How can I learn more? Ask your health care provider. Call your local or state health department. Visit the website of the Food and Drug Administration (FDA) for vaccine package inserts and additional information at FinderList.no. Contact the Centers for Disease Control and Prevention (CDC): Call 9790839027 (1-800-CDC-INFO) or Visit CDC's website at PicCapture.uy. Source: CDC Vaccine Information Statement Tdap (Tetanus, Diphtheria, Pertussis) Vaccine (04/22/2020) This same material is available at FootballExhibition.com.br for no charge. This information is not intended to replace advice given to you by your health care provider. Make sure you discuss any questions you have with your health care provider. Document Revised: 12/19/2022 Document Reviewed: 10/19/2022 Elsevier Patient Education  2024 [employer].

## 2024-07-01 ENCOUNTER — Ambulatory Visit: Payer: Self-pay | Admitting: Certified Nurse Midwife

## 2024-07-01 LAB — 28 WEEK RH+PANEL
Basophils Absolute: 0 x10E3/uL (ref 0.0–0.2)
Basos: 0 %
EOS (ABSOLUTE): 0.1 x10E3/uL (ref 0.0–0.4)
Eos: 1 %
Gestational Diabetes Screen: 101 mg/dL (ref 70–139)
HIV Screen 4th Generation wRfx: NONREACTIVE
Hematocrit: 37.7 % (ref 34.0–46.6)
Hemoglobin: 12.4 g/dL (ref 11.1–15.9)
Immature Grans (Abs): 0.1 x10E3/uL (ref 0.0–0.1)
Immature Granulocytes: 1 %
Lymphocytes Absolute: 1.9 x10E3/uL (ref 0.7–3.1)
Lymphs: 16 %
MCH: 30 pg (ref 26.6–33.0)
MCHC: 32.9 g/dL (ref 31.5–35.7)
MCV: 91 fL (ref 79–97)
Monocytes Absolute: 0.7 x10E3/uL (ref 0.1–0.9)
Monocytes: 6 %
Neutrophils Absolute: 8.9 x10E3/uL — ABNORMAL HIGH (ref 1.4–7.0)
Neutrophils: 76 %
Platelets: 321 x10E3/uL (ref 150–450)
RBC: 4.13 x10E6/uL (ref 3.77–5.28)
RDW: 11.8 % (ref 11.7–15.4)
RPR Ser Ql: NONREACTIVE
WBC: 11.7 x10E3/uL — ABNORMAL HIGH (ref 3.4–10.8)

## 2024-07-15 ENCOUNTER — Encounter: Payer: Self-pay | Admitting: Obstetrics

## 2024-07-15 ENCOUNTER — Ambulatory Visit (INDEPENDENT_AMBULATORY_CARE_PROVIDER_SITE_OTHER): Admitting: Obstetrics

## 2024-07-15 VITALS — BP 105/69 | HR 84 | Wt 178.0 lb

## 2024-07-15 DIAGNOSIS — F32A Depression, unspecified: Secondary | ICD-10-CM | POA: Diagnosis not present

## 2024-07-15 DIAGNOSIS — F419 Anxiety disorder, unspecified: Secondary | ICD-10-CM | POA: Diagnosis not present

## 2024-07-15 DIAGNOSIS — Z3480 Encounter for supervision of other normal pregnancy, unspecified trimester: Secondary | ICD-10-CM

## 2024-07-15 NOTE — Progress Notes (Signed)
    Return Prenatal Note   Assessment/Plan   Plan  28 y.o. G3P1011 at [redacted]w[redacted]d presents for follow-up OB visit. Reviewed prenatal record including previous visit note.  Supervision of other normal pregnancy, antepartum -Discussed comfort measures for reflux. Will try Pepcid -OK to take Unisom for sleep -Discussed making a birth plan -Reviewed kick counts and preterm labor warning signs. Instructed to call office or come to hospital with persistent headache, vision changes, regular contractions, leaking of fluid, decreased fetal movement or vaginal bleeding.      No orders of the defined types were placed in this encounter.  Return in about 2 weeks (around 07/29/2024).   Future Appointments  Date Time Provider Department Center  07/27/2024  8:15 AM Caroline Gonzalez, CNM [facility] None    For next visit:  Routine prenatal care    Subjective   Caroline Gonzalez is exhausted. She is not sleeping well d/t reflux and has trouble going back to sleep if she wakes up in the night. She is thinking about her birth plan.  Movement: Present Contractions: Not present  Objective   Flow sheet Vitals: Pulse Rate: 84 BP: 105/69 Fundal Height: 33 cm Fetal Heart Rate (bpm): 126 Total weight gain: 38 lb (17.2 kg)  General Appearance  No acute distress, well appearing, and well nourished Pulmonary   Normal work of breathing Neurologic   Alert and oriented to person, place, and time Psychiatric   Mood and affect within normal limits  Caroline Gonzalez, CNM 07/15/24 3:18 PM

## 2024-07-15 NOTE — Assessment & Plan Note (Signed)
-  Discussed comfort measures for reflux. Will try Pepcid -OK to take Unisom for sleep -Discussed making a birth plan -Reviewed kick counts and preterm labor warning signs. Instructed to call office or come to hospital with persistent headache, vision changes, regular contractions, leaking of fluid, decreased fetal movement or vaginal bleeding.

## 2024-07-24 NOTE — Progress Notes (Signed)
    Return Prenatal Note   Subjective   28 y.o. G3P1011 at [redacted]w[redacted]d presents for this follow-up prenatal visit.  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 She used her mother's BP cuff to check. Reports feeling increased anxiety about not feeling well & having higher blood pressure. Movement: Present Contractions: Irritability  Objective   Flow sheet Vitals: Pulse Rate: 97 BP: 117/70 Fundal Height: 32 cm Fetal Heart Rate (bpm): 140 Presentation: Vertex (Leopold's) Total weight gain: 39 lb 6.4 oz (17.9 kg)  General Appearance  No acute distress, well appearing, and well nourished Pulmonary   Normal work of breathing Neurologic   Alert and oriented to person, place, and time Psychiatric   Mood and affect within normal limits Results for orders placed or performed in visit on 07/27/24 (from the past 24 hours)  POC Urinalysis Dipstick OB     Status: Abnormal   Collection Time: 07/27/24  8:20 AM  Result Value Ref Range   Color, UA yellow    Clarity, UA clear    Glucose, UA Negative Negative   Bilirubin, UA negative    Ketones, UA negative    Spec Grav, UA 1.015 1.010 - 1.025   Blood, UA negative    pH, UA 6.5 5.0 - 8.0   POC,PROTEIN,UA Trace Negative, Trace, Small (1+), Moderate (2+), Large (3+), 4+   Urobilinogen, UA 0.2 0.2 or 1.0 E.U./dL   Nitrite, UA negative    Leukocytes, UA Trace (A) Negative   Appearance     Odor      Assessment/Plan   Plan  27 y.o. G3P1011 at [redacted]w[redacted]d presents for follow-up OB visit. Reviewed prenatal record including previous visit note.  Supervision of other normal pregnancy, antepartum Reviewed kick counts and preterm labor warning signs. Instructed to call office or come to hospital with persistent headache, vision changes, regular  contractions, leaking of fluid, decreased fetal movement or vaginal bleeding. Reassured of normalcy of BP today, reviewed how to take BP at home and when to present for care. Reviewed usual care for labor & birth including no separation of mom/baby, skin to skin & delayed cord clamping. RSV & Flu vaccines today.      Orders Placed This Encounter  Procedures   Respiratory syncytial virus vaccine, preF, subunit, bivalent,(Abrysvo)   Flu vaccine trivalent PF, 6mos and older(Flulaval,Afluria,Fluarix,Fluzone)   POC Urinalysis Dipstick OB   Return in 2 weeks (on 08/10/2024) for ROB.   Future Appointments  Date Time Provider Department Center  08/10/2024  8:15 AM Dominic, Jinnie Jansky, CNM [facility] None     For next visit:  continue with routine prenatal care     Harlene LITTIE Cisco, CNM  11/10/259:20 AM

## 2024-07-24 NOTE — Patient Instructions (Addendum)
 RSV Vaccine: What You Need to Know Many vaccine information statements are available in Spanish and other languages. See classthemes.se. 1. Why get vaccinated? RSV vaccine can prevent lower respiratory tract disease caused by respiratory syncytial virus (RSV). RSV is a common respiratory virus that usually causes mild, cold-like symptoms. RSV can cause illness in people of all ages but may be especially serious for infants and older adults. RSV is the most common cause of hospitalization in [place] infants. Infants up to 66 months of age (especially those 6 months and younger) and children who were born prematurely, or who have chronic lung or heart disease, or a weakened immune system, are at increased risk of severe RSV disease. RSV infections can be dangerous for certain adults. Adults at highest risk for severe RSV disease include older adults, especially those with chronic heart or lung disease, a weakened immune system, certain other chronic medical conditions, or who live in nursing homes. RSV spreads through direct contact with the virus, such as when droplets from an infected person's cough or sneeze contact your eyes, nose, or mouth. It can also be spread by someone touching a surface, such as a doorknob, that has the virus on it, and then touching your face. Symptoms of RSV infection may include runny nose, decreased appetite, coughing, sneezing, fever, or wheezing. In very young infants, symptoms of RSV may also include irritability (fussiness), decreased activity, or apnea (pauses in breathing for more than 10 seconds). Most people recover in a week or two, but RSV can be more serious, resulting in shortness of breath and low oxygen levels. RSV can cause bronchiolitis (inflammation of the small airways in the lung) and pneumonia (infection of the lungs). RSV can also lead to worsening of other medical conditions such as asthma, chronic obstructive pulmonary disease (a chronic disease of the  lungs that makes it hard to breathe), or heart failure (when the heart cannot pump enough blood and oxygen throughout the body). Infants and older adults who get very sick from RSV may need to be hospitalized. Some may even die. 2. RSV vaccine There are two immunization options available for protecting infants against RSV: maternal vaccine for the pregnant person or preventive antibodies given to the baby. Only one of these options is needed for most babies to be protected. CDC recommends a one-time dose of RSV vaccine for pregnant people from week 32 through week 36 of pregnancy for the prevention of RSV disease in their infants during the first 6 months of life. This vaccine is recommended to be given from September through January for most of the [place] . However, in some locations (for example, the territories, [place] , [place] , and parts of [place] ), the timing of vaccination may differ based on the time of year when RSV circulates in the area. CDC recommends a one-time-dose of RSV vaccine for everyone 75 years and older and for adults 97 through 29 years of age who are at increased risk of severe RSV disease. Adults 37 through 29 years old who are at increased risk include those with chronic heart or lung disease, a weakened immune system, or certain other chronic medical conditions, and those who are residents of nursing homes. RSV vaccine may be given at the same time as other vaccines. 3. Talk with your health care provider Tell your vaccination provider if the person getting the vaccine: Has had an allergic reaction after a previous dose of RSV vaccine, or has any severe, life-threatening allergies In some cases, your health  care provider may decide to postpone RSV vaccination until a future visit.  People with minor illnesses, such as a cold, may be vaccinated. People who are moderately or severely ill should usually wait until they recover before getting RSV vaccine.  Your health care  provider can give you more information. 4. Risks of a vaccine reaction Pain, redness, and swelling where the shot is given, fatigue (feeling tired), fever, headache, nausea, diarrhea, and muscle or joint pain can happen after RSV vaccination. Serious neurologic conditions, including Guillain-Barr syndrome (GBS), have been reported after RSV vaccination in some older adults. At this time, an increased risk of GBS following RSV vaccine among persons aged 26 years and older cannot be confirmed or ruled out. Preterm birth and high blood pressure during pregnancy, including pre-eclampsia, have been reported among pregnant people who received RSV vaccine. It is unclear whether these events were caused by the vaccine. People sometimes faint after medical procedures, including vaccination. Tell your provider if you feel dizzy or have vision changes or ringing in the ears.  As with any medicine, there is a very remote chance of a vaccine causing a severe allergic reaction, other serious injury, or death. V-Safe is a safety monitoring system that lets you share with CDC how you, or your dependent, feel after getting RSV vaccine. You can find information and enroll in V-Safe radarlocations.no. 5. What if there is a serious problem? An allergic reaction could occur after the vaccinated person leaves the clinic. If you see signs of a severe allergic reaction (hives, swelling of the face and throat, difficulty breathing, a fast heartbeat, dizziness, or weakness), call 9-1-1 and get the person to the nearest hospital. For other signs that concern you, call your health care provider.  Adverse reactions should be reported to the Vaccine Adverse Event Reporting System (VAERS). Your health care provider will usually file this report, or you can do it yourself. Visit the VAERS website at www.vaers.lagents.no or call 430-043-9814. VAERS is only for reporting reactions, and VAERS staff members do not give medical advice. 6. How  can I learn more? Ask your health care provider. Call your local or state health department. Visit the website of the Food and Drug Administration (FDA) for vaccine package inserts and additional information at finderlist.no Contact the Centers for Disease Control and Prevention (CDC): Call 708-037-0148 (1-800-CDC-INFO) or Visit CDC's vaccine website at piccapture.uy Source: CDC Vaccine Information Statement RSV (Respiratory Syncytial Virus) Vaccine (07/04/2023) This same material is available at tonerpromos.no for no charge. This information is not intended to replace advice given to you by your health care provider. Make sure you discuss any questions you have with your health care provider. Document Revised: 07/30/2023 Document Reviewed: 09/19/2022 Elsevier Patient Education  2024 [employer]. Third Trimester of Pregnancy  The third trimester of pregnancy is from week 28 through week 40. This is months 7 through 9. The third trimester is a time when your baby is growing fast. Body changes during your third trimester Your body continues to change during this time. The changes usually go away after your baby is born. Physical changes You will continue to gain weight. You may get stretch marks on your hips, belly, and breasts. Your breasts will keep growing and may hurt. A yellow fluid (colostrum) may leak from your breasts. This is the first milk you're making for your baby. Your hair may grow faster and get thicker. In some cases, you may get hair loss. Your belly button may stick out.  You may have more swelling in your hands, face, or ankles. Health changes You may have heartburn. You may feel short of breath. This is caused by the uterus that is now bigger. You may have more aches in the pelvis, back, or thighs. You may have more tingling or numbness in your hands, arms, and legs. You may pee more often. You may have trouble pooping (constipation)  or swollen veins in the butt that can itch or get painful (hemorrhoids). Other changes You may have more problems sleeping. You may notice the baby moving lower in your belly (dropping). You may have more fluid coming from your vagina. Your joints may feel loose, and you may have pain around your pelvic bone. Follow these instructions at home: Medicines Take medicines only as told by your health care provider. Some medicines are not safe during pregnancy. Your provider may change the medicines that you take. Do not take any medicines unless told to by your provider. Take a prenatal vitamin that has at least 600 micrograms (mcg) of folic acid. Do not use herbal medicines, illegal drugs, or medicines that are not approved by your provider. Eating and drinking While you're pregnant your body needs additional nutrition to help support your growing baby. Talk with your provider about your nutritional needs. Activity Most women are able to exercise regularly during pregnancy. Exercise routines may need to change at the end of your pregnancy. Talk to your provider about your activities and exercise routine. Relieving pain and discomfort Rest often with your legs raised if you have leg cramps or low back pain. Take warm sitz baths to soothe pain from hemorrhoids. Use hemorrhoid cream if your provider says it's okay. Wear a good, supportive bra if your breasts hurt. Do not use hot tubs, steam rooms, or saunas. Do not douche. Do not use tampons or scented pads. Safety Talk to your provider before traveling far distances. Wear your seatbelt at all times when you're in a car. Talk to your provider if someone hits you, hurts you, or yells at you. Preparing for birth To prepare for your baby: Take childbirth and breastfeeding classes. Visit the hospital and tour the maternity area. Buy a rear-facing car seat. Learn how to install it in your car. General instructions Avoid cat litter boxes and  soil used by cats. These things carry germs that can cause harm to your pregnancy and your baby. Do not drink alcohol, smoke, vape, or use products with nicotine  or tobacco in them. If you need help quitting, talk with your provider. Keep all follow-up visits for your third trimester. Your provider will do more exams and tests during this trimester. Write down your questions. Take them to your prenatal visits. Your provider also will: Talk with you about your overall health. Give you advice or refer you to specialists who can help with different needs, including: Mental health and counseling. Foods and healthy eating. Ask for help if you need help with food. Where to find more information American Pregnancy Association: americanpregnancy.org [employer]: acog.org Office on [employer] Health: travellesson.ca Contact a health care provider if: You have a headache that does not go away when you take medicine. You have any of these problems: You can't eat or drink. You have nausea and vomiting. You have watery poop (diarrhea) for 2 days or more. You have pain when you pee, or your pee smells bad. You have been sick for 2 days or more and aren't getting better. Contact  your provider right away if: You have any of these coming from your vagina: Abnormal discharge. Bad-smelling fluid. Bleeding. Your baby is moving less than usual. You have signs of labor: You have any contractions, belly cramping, or have pain in your pelvis or lower back before 37 weeks of pregnancy (preterm labor). You have regular contractions that are less than 5 minutes apart. Your water breaks. You have symptoms of high blood pressure or preeclampsia. These include: A severe, throbbing headache that does not go away. Sudden or extreme swelling of your face, hands, legs, or feet. Vision problems: You see spots. You have blurry vision. Your eyes are sensitive to light. If you  can't reach your provider, go to an urgent care or emergency room. Get help right away if: You faint, become confused, or can't think clearly. You have chest pain or trouble breathing. You have any kind of injury, such as from a fall or a car crash. These symptoms may be an emergency. Call 911 right away. Do not wait to see if the symptoms will go away. Do not drive yourself to the hospital. This information is not intended to replace advice given to you by your health care provider. Make sure you discuss any questions you have with your health care provider. Document Revised: 06/06/2023 Document Reviewed: 01/04/2023 Elsevier Patient Education  2024 [employer] Group B Streptococcus Test During Pregnancy Why am I having this test? Routine testing, also called screening, for group B streptococcus (GBS) is recommended for all pregnant women between the 36th and 37th week of pregnancy. GBS is a type of bacteria that can be passed from mother to baby during childbirth. Screening will help guide whether or not you will need treatment during labor and delivery to prevent complications such as: An infection in your uterus during labor. An infection in your uterus after delivery. A serious infection in your baby after delivery, such as pneumonia, meningitis, or sepsis. GBS screening is not often done before 36 weeks of pregnancy unless you go into labor prematurely. What happens if I have group B streptococcus? If testing shows that you have GBS, your health care provider will recommend treatment with IV antibiotics during labor and delivery. This treatment significantly decreases the risk of complications for you and your baby. If you have a planned C-section and you have GBS, you may not need to be treated with antibiotics because GBS is usually passed to babies after labor starts and your water breaks. If you are in labor or your water breaks before your C-section, it is possible for GBS to get  into your uterus and be passed to your baby, so you might need treatment. Is there a chance I may not need to be tested? You may not need to be tested for GBS if: You have a urine test that shows GBS before 36 to 37 weeks. You had a baby with GBS infection after a previous delivery. In these cases, you will automatically be treated for GBS during labor and delivery. What is being tested? This test is done to check if you have group B streptococcus in your vagina or rectum. What kind of sample is taken? To collect samples for this test, your health care provider will swab your vagina and rectum with a cotton swab. The sample is then sent to the lab to see if GBS is present. What happens during the test?  You will remove your clothing from the waist down. You will lie down on an  exam table in the same position as you would for a pelvic exam. Your health care provider will swab your vagina and rectum to collect samples for a culture test. You will be able to go home after the test and do all your usual activities. How are the results reported? The test results are reported as positive or negative. What do the results mean? A positive test means you are at risk for passing GBS to your baby during labor and delivery. Your health care provider will recommend that you are treated with an IV antibiotic during labor and delivery. A negative test means you are at very low risk of passing GBS to your baby. There is still a low risk of passing GBS to your baby because sometimes test results may report that you do not have a condition when you do (false-negative result) or there is a chance that you may become infected with GBS after the test is done. You most likely will not need to be treated with an antibiotic during labor and delivery. Talk with your health care provider about what your results mean. Questions to ask your health care provider Ask your health care provider, or the department that is  doing the test: When will my results be ready? How will I get my results? What are my treatment options? Summary Routine testing (screening) for group B streptococcus (GBS) is recommended for all pregnant women between the 36th and 37th week of pregnancy. GBS is a type of bacteria that can be passed from mother to baby during childbirth. If testing shows that you have GBS, your health care provider will recommend that you are treated with IV antibiotics during labor and delivery. This treatment almost always prevents infection in newborns. This information is not intended to replace advice given to you by your health care provider. Make sure you discuss any questions you have with your health care provider. Document Revised: 08/20/2022 Document Reviewed: 08/20/2022 Elsevier Patient Education  2024 [employer]. Third Trimester of Pregnancy  The third trimester of pregnancy is from week 28 through week 40. This is months 7 through 9. The third trimester is a time when your baby is growing fast. Body changes during your third trimester Your body continues to change during this time. The changes usually go away after your baby is born. Physical changes You will continue to gain weight. You may get stretch marks on your hips, belly, and breasts. Your breasts will keep growing and may hurt. A yellow fluid (colostrum) may leak from your breasts. This is the first milk you're making for your baby. Your hair may grow faster and get thicker. In some cases, you may get hair loss. Your belly button may stick out. You may have more swelling in your hands, face, or ankles. Health changes You may have heartburn. You may feel short of breath. This is caused by the uterus that is now bigger. You may have more aches in the pelvis, back, or thighs. You may have more tingling or numbness in your hands, arms, and legs. You may pee more often. You may have trouble pooping (constipation) or swollen veins in  the butt that can itch or get painful (hemorrhoids). Other changes You may have more problems sleeping. You may notice the baby moving lower in your belly (dropping). You may have more fluid coming from your vagina. Your joints may feel loose, and you may have pain around your pelvic bone. Follow these instructions at home: Medicines Take  medicines only as told by your health care provider. Some medicines are not safe during pregnancy. Your provider may change the medicines that you take. Do not take any medicines unless told to by your provider. Take a prenatal vitamin that has at least 600 micrograms (mcg) of folic acid. Do not use herbal medicines, illegal drugs, or medicines that are not approved by your provider. Eating and drinking While you're pregnant your body needs additional nutrition to help support your growing baby. Talk with your provider about your nutritional needs. Activity Most women are able to exercise regularly during pregnancy. Exercise routines may need to change at the end of your pregnancy. Talk to your provider about your activities and exercise routine. Relieving pain and discomfort Rest often with your legs raised if you have leg cramps or low back pain. Take warm sitz baths to soothe pain from hemorrhoids. Use hemorrhoid cream if your provider says it's okay. Wear a good, supportive bra if your breasts hurt. Do not use hot tubs, steam rooms, or saunas. Do not douche. Do not use tampons or scented pads. Safety Talk to your provider before traveling far distances. Wear your seatbelt at all times when you're in a car. Talk to your provider if someone hits you, hurts you, or yells at you. Preparing for birth To prepare for your baby: Take childbirth and breastfeeding classes. Visit the hospital and tour the maternity area. Buy a rear-facing car seat. Learn how to install it in your car. General instructions Avoid cat litter boxes and soil used by cats.  These things carry germs that can cause harm to your pregnancy and your baby. Do not drink alcohol, smoke, vape, or use products with nicotine  or tobacco in them. If you need help quitting, talk with your provider. Keep all follow-up visits for your third trimester. Your provider will do more exams and tests during this trimester. Write down your questions. Take them to your prenatal visits. Your provider also will: Talk with you about your overall health. Give you advice or refer you to specialists who can help with different needs, including: Mental health and counseling. Foods and healthy eating. Ask for help if you need help with food. Where to find more information American Pregnancy Association: americanpregnancy.org [employer]: acog.org Office on [employer] Health: travellesson.ca Contact a health care provider if: You have a headache that does not go away when you take medicine. You have any of these problems: You can't eat or drink. You have nausea and vomiting. You have watery poop (diarrhea) for 2 days or more. You have pain when you pee, or your pee smells bad. You have been sick for 2 days or more and aren't getting better. Contact your provider right away if: You have any of these coming from your vagina: Abnormal discharge. Bad-smelling fluid. Bleeding. Your baby is moving less than usual. You have signs of labor: You have any contractions, belly cramping, or have pain in your pelvis or lower back before 37 weeks of pregnancy (preterm labor). You have regular contractions that are less than 5 minutes apart. Your water breaks. You have symptoms of high blood pressure or preeclampsia. These include: A severe, throbbing headache that does not go away. Sudden or extreme swelling of your face, hands, legs, or feet. Vision problems: You see spots. You have blurry vision. Your eyes are sensitive to light. If you can't reach your  provider, go to an urgent care or emergency room. Get help right  away if: You faint, become confused, or can't think clearly. You have chest pain or trouble breathing. You have any kind of injury, such as from a fall or a car crash. These symptoms may be an emergency. Call 911 right away. Do not wait to see if the symptoms will go away. Do not drive yourself to the hospital. This information is not intended to replace advice given to you by your health care provider. Make sure you discuss any questions you have with your health care provider. Document Revised: 06/06/2023 Document Reviewed: 01/04/2023 Elsevier Patient Education  2024 [employer] Inc. Early Labor (Pre-Term Labor): What to Know Pregnancy normally lasts 39-41 weeks. Pre-term labor is when labor starts before you have been pregnant for 37 weeks. Babies who are born too early may be at an increased risk for long-term problems like cerebral palsy, developmental delays, and vision and hearing problems. Premature babies may also have problems soon after birth, such as problems with their blood sugar, body temperature, heart, and breathing. These babies often have trouble with feeding. These problems may be very serious in babies who are born before 34 weeks of pregnancy. What are the causes? The cause of pre-term labor is not known. What increases the risk? You're more likely to have pre-term labor if: You have medical problems, now or in the past. You have problems now or in your past pregnancies. You have risk factors related to lifestyle, environment, and age. Medical history You have problems affecting your uterus, including a short cervix. You have a sexually transmitted infection (STI) or other infections of the urinary tract and the vagina. You have: Blood clotting problems. High blood pressure. High blood sugar. You have a low body weight or too much body weight. Present and past pregnancies You have had pre-term labor  before. You're pregnant with more than one baby. The placenta covers your cervix,. This is called placenta previa. Your unborn baby has a congenital condition. This means your baby will be born with the condition. You have bleeding from your vagina while you're pregnant. You became pregnant through in vitro fertilization (IVF). Lifestyle and environmental factors You use drugs, tobacco products or drink alcohol. You have stress and no social support. You experience domestic violence. You're exposed to certain chemicals at home or work. Other factors You're younger than age 80 or older than age 31. What are the signs or symptoms?  Cramps like those that can happen during a menstrual period. The cramps may happen with diarrhea, which is watery poop. Pain your belly or lower back. Regular contractions. It may feel like your belly is getting tighter. Pressure in your pelvis. Increased watery or bloody discharge from your vagina. Your water breaking. How is this diagnosed? This condition is diagnosed based on: Your medical history and a physical exam. A pelvic exam. An ultrasound. Monitoring for contractions. Other tests, including: A swab of the cervix to check for a protein substance called fetal fibronectin. This protein is usually present in the area between the uterus and the amniotic sac early in pregnancy and then again towards the end. If it's found in the middle of pregnancy, it can sometimes be a sign of pre-term labor. Urine tests. How is this treated? Treatment depends on how far along your pregnancy is, the health of your baby, and your health. Treatment may include: Taking medicines to: Stop contractions. Help the baby's lungs mature if the risk of early delivery is high. Medicines to help prevent your baby from having  cerebral palsy or other problems. Bed rest. If the labor happens before 34 weeks of pregnancy, you may need to stay in the hospital. Delivery of the  baby. Follow these instructions at home: Do not smoke, vape, or use nicotine  or tobacco. Do not drink alcohol. Take your medicines only as told. Rest as told by your provider. Ask what things are safe for you to do at home. Ask when you can go back to work or school. Keep all follow-up visits. Your provider will need to closely follow your health and the health of your baby. How is this prevented? To increase your chance of having a full-term pregnancy: Do not use drugs or take medicines that have not been prescribed to you during your pregnancy. Talk with your provider before taking any herbal supplements, even if you have been taking them regularly. Gain a healthy amount of weight during your pregnancy. Watch for infection. If you think that you might have an infection, get it checked right away. Symptoms of infection may include: Fever. Discharge from your vagina that smells bad or is not normal. Pain or burning when you pee. Having to pee small amounts or very often. Blood in your pee. Where to find more information To learn more, go to these websites: [place]. Department of Health and Cytogeneticist on Women's Health: http://hoffman.com/ [employer]necologists: www.acog.org Centers for Disease Control and Prevention at Diningcalendar.de. Then: Click Search and type preterm labor. Find the link you need. Contact a health care provider if: You think you're going into pre-term labor. You have signs or symptoms of pre-term labor. You have symptoms of infection. Get help right away if: You're having regular, painful contractions every 5 minutes or less. Your water breaks. This information is not intended to replace advice given to you by your health care provider. Make sure you discuss any questions you have with your health care provider. Document Revised: 03/14/2023 Document Reviewed: 03/14/2023 Elsevier Patient Education  2024 [employer].

## 2024-07-27 ENCOUNTER — Ambulatory Visit (INDEPENDENT_AMBULATORY_CARE_PROVIDER_SITE_OTHER): Admitting: Certified Nurse Midwife

## 2024-07-27 VITALS — BP 117/70 | HR 97 | Wt 179.4 lb

## 2024-07-27 DIAGNOSIS — R030 Elevated blood-pressure reading, without diagnosis of hypertension: Secondary | ICD-10-CM

## 2024-07-27 DIAGNOSIS — Z23 Encounter for immunization: Secondary | ICD-10-CM

## 2024-07-27 DIAGNOSIS — Z2911 Encounter for prophylactic immunotherapy for respiratory syncytial virus (RSV): Secondary | ICD-10-CM

## 2024-07-27 DIAGNOSIS — Z3A32 32 weeks gestation of pregnancy: Secondary | ICD-10-CM | POA: Diagnosis not present

## 2024-07-27 DIAGNOSIS — Z3483 Encounter for supervision of other normal pregnancy, third trimester: Secondary | ICD-10-CM

## 2024-07-27 DIAGNOSIS — Z3480 Encounter for supervision of other normal pregnancy, unspecified trimester: Secondary | ICD-10-CM

## 2024-07-27 LAB — POCT URINALYSIS DIPSTICK OB
Bilirubin, UA: NEGATIVE
Blood, UA: NEGATIVE
Glucose, UA: NEGATIVE
Ketones, UA: NEGATIVE
Nitrite, UA: NEGATIVE
Spec Grav, UA: 1.015 (ref 1.010–1.025)
Urobilinogen, UA: 0.2 U/dL
pH, UA: 6.5 (ref 5.0–8.0)

## 2024-07-27 NOTE — Assessment & Plan Note (Addendum)
 Reviewed kick counts and preterm labor warning signs. Instructed to call office or come to hospital with persistent headache, vision changes, regular contractions, leaking of fluid, decreased fetal movement or vaginal bleeding. Reassured of normalcy of BP today, reviewed how to take BP at home and when to present for care. Reviewed usual care for labor & birth including no separation of mom/baby, skin to skin & delayed cord clamping. RSV & Flu vaccines today.

## 2024-07-29 ENCOUNTER — Encounter: Payer: Self-pay | Admitting: Certified Nurse Midwife

## 2024-08-10 ENCOUNTER — Ambulatory Visit: Admitting: Licensed Practical Nurse

## 2024-08-10 ENCOUNTER — Encounter: Payer: Self-pay | Admitting: Licensed Practical Nurse

## 2024-08-10 VITALS — BP 127/79 | HR 85 | Wt 181.7 lb

## 2024-08-10 DIAGNOSIS — F419 Anxiety disorder, unspecified: Secondary | ICD-10-CM

## 2024-08-10 DIAGNOSIS — Z3A34 34 weeks gestation of pregnancy: Secondary | ICD-10-CM

## 2024-08-10 DIAGNOSIS — F418 Other specified anxiety disorders: Secondary | ICD-10-CM | POA: Diagnosis not present

## 2024-08-10 DIAGNOSIS — Z3480 Encounter for supervision of other normal pregnancy, unspecified trimester: Secondary | ICD-10-CM

## 2024-08-10 DIAGNOSIS — O99343 Other mental disorders complicating pregnancy, third trimester: Secondary | ICD-10-CM | POA: Diagnosis not present

## 2024-08-10 NOTE — Progress Notes (Signed)
    Return Prenatal Note   Subjective   28 y.o. G3P1011 at [redacted]w[redacted]d presents for this follow-up prenatal visit.  Patient has no concerns today. Patient reports: feeling tired, moved into new house.  -Has been crying a lot, feels it is related to being tired and overworked, over the weekend she bursted into  tears for 30 mins for no apparent reason, has been anxious-but always is,  -Npt sleeping d/t being uncomfortable, hips and lower  are hurting -Mood was fine PP until she stopped breastfeeding at 14months, was put on Lexapro , stopped meds did therapy, does not want to use medication, has coping tools, just has not used them  Movement: Present Contractions: Irritability  Objective   Flow sheet Vitals: Pulse Rate: 85 BP: 127/79 Fundal Height: 35 cm Fetal Heart Rate (bpm): 130 Total weight gain: 41 lb 11.2 oz (18.9 kg)  General Appearance  No acute distress, well appearing, and well nourished Pulmonary   Normal work of breathing Neurologic   Alert and oriented to person, place, and time Psychiatric   Mood and affect within normal limits   Assessment/Plan   Plan  28 y.o. G3P1011 at [redacted]w[redacted]d presents for follow-up OB visit. Reviewed prenatal record including previous visit note.  Anxiety and depression -Encouraged to reestablish care with a therapist -Encouraged pt to use coping mechanisms she once used -may try herbs, passion flower and skullcap, consider Mother wart once PP  Supervision of other normal pregnancy, antepartum -TWG 41lbs, gained 60lbs in last prefnancy, encouarged regular physical actiivty and eating well -36wk labs next visit  -warning signs reviewed       No orders of the defined types were placed in this encounter.  Return in about 2 weeks (around 08/24/2024) for ROB, 36 wk labs.   No future appointments.  For next visit:  ROB with GBS screening      JINNIE HERO [facility], CNM  11/24/258:48 AM

## 2024-08-10 NOTE — Assessment & Plan Note (Signed)
-  Encouraged to reestablish care with a therapist -Encouraged pt to use coping mechanisms she once used -may try herbs, passion flower and skullcap, consider Mother wart once PP

## 2024-08-10 NOTE — Assessment & Plan Note (Signed)
-  TWG 41lbs, gained 60lbs in last prefnancy, encouarged regular physical actiivty and eating well -36wk labs next visit  -warning signs reviewed

## 2024-08-25 NOTE — Progress Notes (Unsigned)
    Return Prenatal Note   Subjective   28 y.o. G3P1011 at [redacted]w[redacted]d presents for this follow-up prenatal visit.  Patient *** Patient reports:    Objective   Flow sheet Vitals:   Total weight gain: 41 lb 11.2 oz (18.9 kg)  General Appearance  No acute distress, well appearing, and well nourished Pulmonary   Normal work of breathing Neurologic   Alert and oriented to person, place, and time Psychiatric   Mood and affect within normal limits   Assessment/Plan   Plan  28 y.o. G3P1011 at [redacted]w[redacted]d presents for follow-up OB visit. Reviewed prenatal record including previous visit note.  No problem-specific Assessment & Plan notes found for this encounter.      No orders of the defined types were placed in this encounter.  No follow-ups on file.   Future Appointments  Date Time Provider Department Center  08/26/2024  1:15 PM Slaughterbeck, Damien, CNM [facility] None    For next visit:  {[email or web address]}     Camelia LITTIE Fetters, CMA  12/09/253:43 PM

## 2024-08-25 NOTE — Patient Instructions (Signed)
 Fetal Movement Counts When you're pregnant, you might start feeling your baby move around the middle of your pregnancy. At first, these movements might feel like flutters, rolls, or swishes. As your baby grows, you might feel more kicks and jabs. Around week 28 of your pregnancy, your health care team may ask you to count how often your baby moves. This is important for all pregnancies, but especially for high-risk ones. Counting movements can help lessen the risk of stillbirth. What is a fetal movement count? A fetal movement count is the number of times that you feel your baby move during a certain amount of time. This may also be called a kick count. There are many ways to do a kick count. Ask your team what is best for you. Pay attention to when your baby is most active. You may notice your baby's sleep and wake cycles. You may also notice things that make your baby move more. When you do a kick count, try to do it: When your baby is normally most active. At the same time each day. How do I count fetal movements?  Find a quiet, comfortable area. Sit or lie down. Write down the date, the start time, and the number of movements you feel. Count kicks, flutters, swishes, rolls, and jabs. Usually, you will feel at least 10 movements within 2 hours. Stop counting after you have felt 10 movements or if you have been counting for 2 hours. Write down the stop time. Contact a health care provider if: You don't feel 10 movements in 2 hours. Your baby isn't moving as it usually does. Your baby isn't moving at all. If you're not able to reach your provider, go to an emergency room. This information is not intended to replace advice given to you by your health care provider. Make sure you discuss any questions you have with your health care provider.   Third Trimester of Pregnancy  The third trimester of pregnancy is from week 28 through week 40. This is months 7 through 9. The third trimester is a  time when your baby is growing fast. Body changes during your third trimester Your body continues to change during this time. The changes usually go away after your baby is born. Physical changes You will continue to gain weight. You may get stretch marks on your hips, belly, and breasts. Your breasts will keep growing and may hurt. A yellow fluid (colostrum) may leak from your breasts. This is the first milk you're making for your baby. Your hair may grow faster and get thicker. In some cases, you may get hair loss. Your belly button may stick out. You may have more swelling in your hands, face, or ankles. Health changes You may have heartburn. You may feel short of breath. This is caused by the uterus that is now bigger. You may have more aches in the pelvis, back, or thighs. You may have more tingling or numbness in your hands, arms, and legs. You may pee more often. You may have trouble pooping (constipation) or swollen veins in the butt that can itch or get painful (hemorrhoids). Other changes You may have more problems sleeping. You may notice the baby moving lower in your belly (dropping). You may have more fluid coming from your vagina. Your joints may feel loose, and you may have pain around your pelvic bone. Follow these instructions at home: Medicines Take medicines only as told by your health care provider. Some medicines are not safe during  pregnancy. Your provider may change the medicines that you take. Do not take any medicines unless told to by your provider. Take a prenatal vitamin that has at least 600 micrograms (mcg) of folic acid. Do not use herbal medicines, illegal drugs, or medicines that are not approved by your provider. Eating and drinking While you're pregnant your body needs additional nutrition to help support your growing baby. Talk with your provider about your nutritional needs. Activity Most women are able to exercise regularly during pregnancy.  Exercise routines may need to change at the end of your pregnancy. Talk to your provider about your activities and exercise routine. Relieving pain and discomfort Rest often with your legs raised if you have leg cramps or low back pain. Take warm sitz baths to soothe pain from hemorrhoids. Use hemorrhoid cream if your provider says it's okay. Wear a good, supportive bra if your breasts hurt. Do not use hot tubs, steam rooms, or saunas. Do not douche. Do not use tampons or scented pads. Safety Talk to your provider before traveling far distances. Wear your seatbelt at all times when you're in a car. Talk to your provider if someone hits you, hurts you, or yells at you. Preparing for birth To prepare for your baby: Take childbirth and breastfeeding classes. Visit the hospital and tour the maternity area. Buy a rear-facing car seat. Learn how to install it in your car. General instructions Avoid cat litter boxes and soil used by cats. These things carry germs that can cause harm to your pregnancy and your baby. Do not drink alcohol, smoke, vape, or use products with nicotine or tobacco in them. If you need help quitting, talk with your provider. Keep all follow-up visits for your third trimester. Your provider will do more exams and tests during this trimester. Write down your questions. Take them to your prenatal visits. Your provider also will: Talk with you about your overall health. Give you advice or refer you to specialists who can help with different needs, including: Mental health and counseling. Foods and healthy eating. Ask for help if you need help with food. Where to find more information American Pregnancy Association: americanpregnancy.org [employer]ians and Gynecologists: acog.org Office on [employer] Health: travellesson.ca Contact a health care provider if: You have a headache that does not go away when you take medicine. You have any of these  problems: You can't eat or drink. You have nausea and vomiting. You have watery poop (diarrhea) for 2 days or more. You have pain when you pee, or your pee smells bad. You have been sick for 2 days or more and aren't getting better. Contact your provider right away if: You have any of these coming from your vagina: Abnormal discharge. Bad-smelling fluid. Bleeding. Your baby is moving less than usual. You have signs of labor: You have any contractions, belly cramping, or have pain in your pelvis or lower back before 37 weeks of pregnancy (preterm labor). You have regular contractions that are less than 5 minutes apart. Your water breaks. You have symptoms of high blood pressure or preeclampsia. These include: A severe, throbbing headache that does not go away. Sudden or extreme swelling of your face, hands, legs, or feet. Vision problems: You see spots. You have blurry vision. Your eyes are sensitive to light. If you can't reach your provider, go to an urgent care or emergency room. Get help right away if: You faint, become confused, or can't think clearly. You have chest pain or  trouble breathing. You have any kind of injury, such as from a fall or a car crash. These symptoms may be an emergency. Call 911 right away. Do not wait to see if the symptoms will go away. Do not drive yourself to the hospital. This information is not intended to replace advice given to you by your health care provider. Make sure you discuss any questions you have with your health care provider. Document Revised: 06/06/2023 Document Reviewed: 01/04/2023 Elsevier Patient Education  2024 Elsevier Inc. Document Revised: 09/27/2023 Document Reviewed: 09/19/2022 Elsevier Patient Education  2025 [employer].

## 2024-08-26 ENCOUNTER — Ambulatory Visit: Admitting: Certified Nurse Midwife

## 2024-08-26 ENCOUNTER — Encounter: Payer: Self-pay | Admitting: Certified Nurse Midwife

## 2024-08-26 ENCOUNTER — Other Ambulatory Visit (HOSPITAL_COMMUNITY)
Admission: RE | Admit: 2024-08-26 | Discharge: 2024-08-26 | Disposition: A | Discharge status: Home / Self Care | Source: Ambulatory Visit | Attending: Certified Nurse Midwife | Admitting: Certified Nurse Midwife

## 2024-08-26 VITALS — BP 123/80 | HR 79 | Wt 184.9 lb

## 2024-08-26 DIAGNOSIS — Z3685 Encounter for antenatal screening for Streptococcus B: Secondary | ICD-10-CM | POA: Diagnosis not present

## 2024-08-26 DIAGNOSIS — O99343 Other mental disorders complicating pregnancy, third trimester: Secondary | ICD-10-CM | POA: Diagnosis not present

## 2024-08-26 DIAGNOSIS — Z3480 Encounter for supervision of other normal pregnancy, unspecified trimester: Secondary | ICD-10-CM | POA: Diagnosis present

## 2024-08-26 DIAGNOSIS — F418 Other specified anxiety disorders: Secondary | ICD-10-CM | POA: Diagnosis not present

## 2024-08-26 DIAGNOSIS — Z113 Encounter for screening for infections with a predominantly sexual mode of transmission: Secondary | ICD-10-CM | POA: Diagnosis present

## 2024-08-26 DIAGNOSIS — F32A Depression, unspecified: Secondary | ICD-10-CM

## 2024-08-26 DIAGNOSIS — Z3A37 37 weeks gestation of pregnancy: Secondary | ICD-10-CM | POA: Diagnosis not present

## 2024-08-26 NOTE — Assessment & Plan Note (Addendum)
-  Anticipatory guidance for labor, discussed how to recognize labor signs and when to go to the hospital.  -Reviewed labor comfort measures, patient would like an epidural for pain management in labor. -Patient is working on birth plan that she will bring with her next time to discuss -Recommended elevating lower extremities when possible to help with night time swelling - Recommended Magnesium glycinates to aid with sleep comfort at night

## 2024-08-26 NOTE — Assessment & Plan Note (Addendum)
 Feels like she has normal pregnancy mood changes, does not feel like she needs any  medication. Feels good support at home.

## 2024-08-26 NOTE — Progress Notes (Signed)
° ° °  Return Prenatal Note   Subjective   28 y.o. G3P1011 at [redacted]w[redacted]d presents for this follow-up prenatal visit.  Patient is doing well. She has been having some B.H. contractions on and off. She has some swelling in her hands and feet at the end of the day. She had her vaginal swabs done today. She has no new concerns. Patient reports: Recently moving and having increased stress due to that. Otherwise has felt good.  Movement: Present Contractions: Irritability  Objective   Flow sheet Vitals: Pulse Rate: 79 BP: 123/80 Fundal Height: 37 cm Fetal Heart Rate (bpm): 130 Total weight gain: 44 lb 14.4 oz (20.4 kg)  General Appearance  No acute distress, well appearing, and well nourished Pulmonary   Normal work of breathing Neurologic   Alert and oriented to person, place, and time Psychiatric   Mood and affect within normal limits   Assessment/Plan   Plan  28 y.o. G3P1011 at [redacted]w[redacted]d presents for follow-up OB visit. Reviewed prenatal record including previous visit note.  Anxiety and depression Feels like she has normal pregnancy mood changes, does not feel like she needs any  medication. Feels good support at home.   Supervision of other normal pregnancy, antepartum -Anticipatory guidance for labor, discussed how to recognize labor signs and when to go to the hospital.  -Reviewed labor comfort measures, patient would like an epidural for pain management in labor. -Patient is working on birth plan that she will bring with her next time to discuss -Recommended elevating lower extremities when possible to help with night time swelling - Recommended Magnesium glycinates to aid with sleep comfort at night       Orders Placed This Encounter  Procedures   Strep Gp B NAA   Return in about 1 week (around 09/02/2024) for routine prenatal care.   Future Appointments  Date Time Provider Department Center  09/04/2024 10:35 AM Caroline Gonzalez, CNM [facility] None     For next visit:   continue with routine prenatal care     Caroline Gonzalez, CNM [facility] 12/10/254:01 PM

## 2024-08-28 LAB — CERVICOVAGINAL ANCILLARY ONLY
Chlamydia: NEGATIVE
Comment: NEGATIVE
Comment: NORMAL
Neisseria Gonorrhea: NEGATIVE

## 2024-08-28 LAB — STREP GP B NAA: Strep Gp B NAA: NEGATIVE

## 2024-09-03 NOTE — Patient Instructions (Signed)
 Signs and Symptoms of Labor Labor is the body's natural process of moving the baby and the placenta out of the uterus. The process of labor usually starts when the baby is full-term, between 74 and 41 weeks of pregnancy. Signs and symptoms that you are close to going into labor As your body prepares for labor and the birth of your baby, you may notice the following symptoms in the weeks and days before true labor starts: Passing a small amount of thick, bloody mucus from your vagina. This is called normal bloody show or losing your mucus plug. This may happen more than a week before labor begins, or right before labor begins, as the opening of the cervix starts to widen (dilate). For some women, the entire mucus plug passes at once. For others, pieces of the mucus plug may gradually pass over several days. Your baby moving (dropping) lower in your pelvis to get into position for birth (lightening). When this happens, you may feel more pressure on your bladder and pelvic bone and less pressure on your ribs. This may make it easier to breathe. It may also cause you to need to urinate more often and have problems with bowel movements. Having "practice contractions," also called Braxton Hicks contractions or false labor. These occur at irregular (unevenly spaced) intervals that are more than 10 minutes apart. False labor contractions are common after exercise or sexual activity. They will stop if you change position, rest, or drink fluids. These contractions are usually mild and do not get stronger over time. They may feel like: A backache or back pain. Mild cramps, similar to menstrual cramps. Tightening or pressure in your abdomen. Other early symptoms include: Nausea or loss of appetite. Diarrhea. Having a sudden burst of energy, or feeling very tired. Mood changes. Having trouble sleeping. Signs and symptoms that labor has begun Signs that you are in labor may include: Having contractions that come  at regular (evenly spaced) intervals and increase in intensity. This may feel like more intense tightening or pressure in your abdomen that moves to your back. Contractions may also feel like rhythmic pain in your upper thighs or back that comes and goes at regular intervals. If you are delivering for the first time, this change in intensity of contractions often occurs at a more gradual pace. If you have given birth before, you may notice a more rapid progression of contraction changes. Feeling pressure in the vaginal area. Your water breaking (rupture of membranes). This is when the sac of fluid that surrounds your baby breaks. Fluid leaking from your vagina may be clear or blood-tinged. Labor usually starts within 24 hours of your water breaking, but it may take longer to begin. Some people may feel a sudden gush of fluid; others may notice repeatedly damp underwear. Follow these instructions at home:  When labor starts, or if your water breaks, call your health care provider or nurse care line. Based on your situation, they will determine when you should go in for an exam. During early labor, you may be able to rest and manage symptoms at home. Some strategies to try at home include: Breathing and relaxation techniques. Taking a warm bath or shower. Listening to music. Using a heating pad on the lower back for pain. If directed, apply heat to the area as often as told by your health care provider. Use the heat source that your health care provider recommends, such as a moist heat pack or a heating pad. Place a  towel between your skin and the heat source. Leave the heat on for 20-30 minutes. Remove the heat if your skin turns bright red. This is especially important if you are unable to feel pain, heat, or cold. You have a greater risk of getting burned. Contact a health care provider if: Your labor has started. Your water breaks. You have nausea, vomiting, or diarrhea. Get help right away  if: You have painful, regular contractions that are 5 minutes apart or less. Labor starts before you are [redacted] weeks along in your pregnancy. You have a fever. You have bright red blood coming from your vagina. You do not feel your baby moving. You have a severe headache with or without vision problems. You have chest pain or shortness of breath. These symptoms may represent a serious problem that is an emergency. Do not wait to see if the symptoms will go away. Get medical help right away. Call your local emergency services (911 in the [place] Do not drive yourself to the hospital. Summary Labor is your body's natural process of moving your baby and the placenta out of your uterus. The process of labor usually starts when your baby is full-term, between 25 and 40 weeks of pregnancy. When labor starts, or if your water breaks, call your health care provider or nurse care line. Based on your situation, they will determine when you should go in for an exam. This information is not intended to replace advice given to you by your health care provider. Make sure you discuss any questions you have with your health care provider. Document Revised: 01/17/2021 Document Reviewed: 01/17/2021 Elsevier Patient Education  2024 [employer].

## 2024-09-04 ENCOUNTER — Ambulatory Visit (INDEPENDENT_AMBULATORY_CARE_PROVIDER_SITE_OTHER): Admitting: Advanced Practice Midwife

## 2024-09-04 ENCOUNTER — Encounter: Payer: Self-pay | Admitting: Advanced Practice Midwife

## 2024-09-04 VITALS — BP 127/80 | HR 93 | Wt 187.7 lb

## 2024-09-04 DIAGNOSIS — Z3480 Encounter for supervision of other normal pregnancy, unspecified trimester: Secondary | ICD-10-CM

## 2024-09-04 DIAGNOSIS — Z3A38 38 weeks gestation of pregnancy: Secondary | ICD-10-CM | POA: Diagnosis not present

## 2024-09-04 DIAGNOSIS — Z3483 Encounter for supervision of other normal pregnancy, third trimester: Secondary | ICD-10-CM | POA: Diagnosis not present

## 2024-09-04 NOTE — Progress Notes (Signed)
 "   Routine Prenatal Care Visit  Subjective  Caroline Gonzalez is a 28 y.o. G3P1011 at [redacted]w[redacted]d being seen today for ongoing prenatal care.  She is currently monitored for the following issues for this low-risk pregnancy and has Anxiety and depression; Supervision of other normal pregnancy, antepartum; and History of 1 spontaneous abortion on their problem list.  ----------------------------------------------------------------------------------- Patient reports she is doing well. Having occasional painful contractions. Reviewed labor precautions. She thinks her initial elevated BP was due to stressful/surprise remodeling of bathroom at home! Contractions: Not present. Vag. Bleeding: None.  Movement: Present. Leaking Fluid denies.  ----------------------------------------------------------------------------------- The following portions of the patient's history were reviewed and updated as appropriate: allergies, current medications, past family history, past medical history, past social history, past surgical history and problem list. Problem list updated.  Objective  BP 127/80   Pulse 93   Wt 187 lb 11.2 oz (85.1 kg)   LMP 12/11/2023 (Approximate)   BMI 29.40 kg/m  Pregravid weight 140 lb (63.5 kg) Total Weight Gain 47 lb 11.2 oz (21.6 kg) Urinalysis: Urine Protein    Urine Glucose    Fetal Status: Fetal Heart Rate (bpm): 147 Fundal Height: 38 cm Movement: Present      General:  Alert, oriented and cooperative. Patient is in no acute distress.  Skin: Skin is warm and dry. No rash noted.   Cardiovascular: Normal heart rate noted  Respiratory: Normal respiratory effort, no problems with respiration noted  Abdomen: Soft, gravid, appropriate for gestational age. Pain/Pressure: Absent     Pelvic:  Cervical exam deferred        Extremities: Normal range of motion.  Edema: None  Mental Status: Normal mood and affect. Normal behavior. Normal judgment and thought content.   Assessment    28 y.o. G3P1011 at [redacted]w[redacted]d by  09/16/2024, by Last Menstrual Period presenting for routine prenatal visit  Plan   THIRD Problems (from 02/07/24 to present)     Problem Noted Diagnosed Resolved   Supervision of other normal pregnancy, antepartum 02/07/2024 by Loralyn Sander, CMA  No   Overview Addendum 09/04/2024 10:44 AM by Taft Salines, LPN   Clinical Staff Provider  Office Location  [facility] Dating  LMP  Language  English Anatomy US   Normal; Posterior placenta  Flu Vaccine  07/27/24 Genetic Screen  NIPS: Neg; Female  TDaP vaccine   06/30/24 Hgb A1C or  GTT Early : Not done Third trimester : 101  Covid Denied   LAB RESULTS   Rhogam  B/Positive/-- (06/20 1600)  Blood Type B/Positive/-- (06/20 1600)   RSV 07/27/24 Antibody Negative (06/20 1600)  Feeding Plan Breastfeed Rubella 1.93 (06/20 1600)  Contraception No RPR Non Reactive (06/20 1600)   Circumcision Yes HBsAg Negative (06/20 1600)   Pediatrician  [facility] HIV Non Reactive (06/20 1600)  Support Person Husband Varicella Reactive (06/20 1600)  Prenatal Classes Yes GBS  (For PCN allergy, check sensitivities)     Hep C Non Reactive (06/20 1600)   BTL Consent NA Pap Diagnosis  Date Value Ref Range Status  03/06/2024   Final   - Negative for intraepithelial lesion or malignancy (NILM)    VBAC Consent NA Hgb Electro  Normal 03/06/24    CF      SMA                    Term labor symptoms and general obstetric precautions including but not limited to vaginal bleeding, contractions, leaking of fluid and fetal  movement were reviewed in detail with the patient. Please refer to After Visit Summary for other counseling recommendations.   Return in about 1 week (around 09/11/2024) for rob.  Slater Rains, CNM 09/04/2024 11:22 AM    "

## 2024-09-11 ENCOUNTER — Encounter: Payer: Self-pay | Admitting: Certified Nurse Midwife

## 2024-09-11 ENCOUNTER — Ambulatory Visit: Admitting: Certified Nurse Midwife

## 2024-09-11 ENCOUNTER — Other Ambulatory Visit: Payer: Self-pay | Admitting: Certified Nurse Midwife

## 2024-09-11 VITALS — Wt 191.1 lb

## 2024-09-11 DIAGNOSIS — Z3A39 39 weeks gestation of pregnancy: Secondary | ICD-10-CM

## 2024-09-11 DIAGNOSIS — Z3483 Encounter for supervision of other normal pregnancy, third trimester: Secondary | ICD-10-CM

## 2024-09-11 NOTE — Patient Instructions (Signed)
 Signs and Symptoms of Labor Labor is the body's natural process of moving the baby and the placenta out of the uterus. The process of labor usually starts when the baby is full-term, between 74 and 41 weeks of pregnancy. Signs and symptoms that you are close to going into labor As your body prepares for labor and the birth of your baby, you may notice the following symptoms in the weeks and days before true labor starts: Passing a small amount of thick, bloody mucus from your vagina. This is called normal bloody show or losing your mucus plug. This may happen more than a week before labor begins, or right before labor begins, as the opening of the cervix starts to widen (dilate). For some women, the entire mucus plug passes at once. For others, pieces of the mucus plug may gradually pass over several days. Your baby moving (dropping) lower in your pelvis to get into position for birth (lightening). When this happens, you may feel more pressure on your bladder and pelvic bone and less pressure on your ribs. This may make it easier to breathe. It may also cause you to need to urinate more often and have problems with bowel movements. Having "practice contractions," also called Braxton Hicks contractions or false labor. These occur at irregular (unevenly spaced) intervals that are more than 10 minutes apart. False labor contractions are common after exercise or sexual activity. They will stop if you change position, rest, or drink fluids. These contractions are usually mild and do not get stronger over time. They may feel like: A backache or back pain. Mild cramps, similar to menstrual cramps. Tightening or pressure in your abdomen. Other early symptoms include: Nausea or loss of appetite. Diarrhea. Having a sudden burst of energy, or feeling very tired. Mood changes. Having trouble sleeping. Signs and symptoms that labor has begun Signs that you are in labor may include: Having contractions that come  at regular (evenly spaced) intervals and increase in intensity. This may feel like more intense tightening or pressure in your abdomen that moves to your back. Contractions may also feel like rhythmic pain in your upper thighs or back that comes and goes at regular intervals. If you are delivering for the first time, this change in intensity of contractions often occurs at a more gradual pace. If you have given birth before, you may notice a more rapid progression of contraction changes. Feeling pressure in the vaginal area. Your water breaking (rupture of membranes). This is when the sac of fluid that surrounds your baby breaks. Fluid leaking from your vagina may be clear or blood-tinged. Labor usually starts within 24 hours of your water breaking, but it may take longer to begin. Some people may feel a sudden gush of fluid; others may notice repeatedly damp underwear. Follow these instructions at home:  When labor starts, or if your water breaks, call your health care provider or nurse care line. Based on your situation, they will determine when you should go in for an exam. During early labor, you may be able to rest and manage symptoms at home. Some strategies to try at home include: Breathing and relaxation techniques. Taking a warm bath or shower. Listening to music. Using a heating pad on the lower back for pain. If directed, apply heat to the area as often as told by your health care provider. Use the heat source that your health care provider recommends, such as a moist heat pack or a heating pad. Place a  towel between your skin and the heat source. Leave the heat on for 20-30 minutes. Remove the heat if your skin turns bright red. This is especially important if you are unable to feel pain, heat, or cold. You have a greater risk of getting burned. Contact a health care provider if: Your labor has started. Your water breaks. You have nausea, vomiting, or diarrhea. Get help right away  if: You have painful, regular contractions that are 5 minutes apart or less. Labor starts before you are [redacted] weeks along in your pregnancy. You have a fever. You have bright red blood coming from your vagina. You do not feel your baby moving. You have a severe headache with or without vision problems. You have chest pain or shortness of breath. These symptoms may represent a serious problem that is an emergency. Do not wait to see if the symptoms will go away. Get medical help right away. Call your local emergency services (911 in the [place] Do not drive yourself to the hospital. Summary Labor is your body's natural process of moving your baby and the placenta out of your uterus. The process of labor usually starts when your baby is full-term, between 25 and 40 weeks of pregnancy. When labor starts, or if your water breaks, call your health care provider or nurse care line. Based on your situation, they will determine when you should go in for an exam. This information is not intended to replace advice given to you by your health care provider. Make sure you discuss any questions you have with your health care provider. Document Revised: 01/17/2021 Document Reviewed: 01/17/2021 Elsevier Patient Education  2024 [employer].

## 2024-09-11 NOTE — Progress Notes (Signed)
 ROB doing well, feeling good movement. Having some braxton hicks contractions. State she may have lost her mucus plug the other day in the shower. Labor precautions reviewed. Sve per pt requesting 3/60/-3 station. Follow up 1 week for ROB and NST.  Pt requests induction as soon as possible. Discussed induction after 40 weeks. She is in agreement. Will work on getting her scheduled.   Zelda Hummer, CNM

## 2024-09-15 ENCOUNTER — Telehealth: Payer: Self-pay

## 2024-09-15 NOTE — Telephone Encounter (Signed)
 Spoke with Caroline Gonzalez, advised he is not on DPR and I had tried to contact patient, but was unable to reach her. He advised she went to work. Advised I will send her info via my chart. Also advised for new DPR to be signed the next time she is seen so that we can speak with him in the future. Signs of labor sent to patient via my chart.

## 2024-09-15 NOTE — Telephone Encounter (Signed)
 TRIAGE VOICEMAIL: [name] calling reporting patient has had a lot of ctx the last 48 hours, but they aren't sure if they are close enough or enough ctx to go to hospital. Inquiring if she can be see to check to see if she is in labor.

## 2024-09-16 DIAGNOSIS — Z0289 Encounter for other administrative examinations: Secondary | ICD-10-CM

## 2024-09-17 NOTE — L&D Delivery Note (Signed)
"     Delivery Note   Caroline Gonzalez is a 29 y.o. G3P1011 at [redacted]w[redacted]d Estimated Date of Delivery: 09/16/24  PRE-OPERATIVE DIAGNOSIS:  1) [redacted]w[redacted]d pregnancy.   POST-OPERATIVE DIAGNOSIS:  1) [redacted]w[redacted]d pregnancy s/p Vaginal, Spontaneous   Delivery Type: Vaginal, Spontaneous   Delivery Anesthesia: Epidural  Labor Complications: none    ESTIMATED BLOOD LOSS: 300 ml    FINDINGS:   1) female infant, Apgar scores of 9   at 1 minute and 9   at 5 minutes and a birthweight of pending.   SPECIMENS:   PLACENTA:   Appearance: Intact   Removal: Spontaneous     Disposition: Discarded  CORD BLOOD: Collected/Not Indicated  DISPOSITION:  Infant left in stable condition in the delivery room, with L&D personnel and mother.  NARRATIVE SUMMARY: Labor course:  Caroline Gonzalez is a G3P1011 at [redacted]w[redacted]d who presented to Labor & Delivery for induction of labor. Her initial cervical exam was 3/50/-2. Labor proceeded with augmentation and she was found to be completely dilated at 1907. With excellent maternal pushing effort, she birthed a viable female infant at 19:26. There was not a nuchal cord. The shoulders were birthed without difficulty. The infant was placed skin-to-skin with mother. The cord was doubly clamped and cut when pulsations ceased. The placenta delivered spontaneously and was noted to be intact with a 3VC. A perineal and vaginal examination was performed. Episiotomy/Lacerations: None The patient tolerated this well. Mother and baby were left in stable condition.  Caroline Gonzalez, Student-MidWife  09/19/2024 8:58 PM  "

## 2024-09-18 ENCOUNTER — Ambulatory Visit: Admitting: Advanced Practice Midwife

## 2024-09-18 ENCOUNTER — Other Ambulatory Visit

## 2024-09-18 ENCOUNTER — Encounter: Payer: Self-pay | Admitting: Advanced Practice Midwife

## 2024-09-18 VITALS — BP 125/84 | HR 81 | Wt 191.3 lb

## 2024-09-18 DIAGNOSIS — O480 Post-term pregnancy: Secondary | ICD-10-CM | POA: Diagnosis not present

## 2024-09-18 DIAGNOSIS — Z3A40 40 weeks gestation of pregnancy: Secondary | ICD-10-CM

## 2024-09-18 DIAGNOSIS — Z3480 Encounter for supervision of other normal pregnancy, unspecified trimester: Secondary | ICD-10-CM

## 2024-09-18 NOTE — Progress Notes (Signed)
 "   Routine Prenatal Care Visit  Subjective  Mayda Gonzalez is a 29 y.o. G3P1011 at [redacted]w[redacted]d being seen today for ongoing prenatal care.  She is currently monitored for the following issues for this low-risk pregnancy and has Anxiety and depression; Supervision of other normal pregnancy, antepartum; and History of 1 spontaneous abortion on their problem list.  ----------------------------------------------------------------------------------- Patient reports no complaints. Reviewed induction methods. Her questions are answered.  Contractions: Irregular. Vag. Bleeding: None.  Movement: Present. Leaking Fluid denies.  ----------------------------------------------------------------------------------- The following portions of the patient's history were reviewed and updated as appropriate: allergies, current medications, past family history, past medical history, past social history, past surgical history and problem list. Problem list updated.  Objective  Last menstrual period 12/11/2023. Pregravid weight 140 lb (63.5 kg) Total Weight Gain 51 lb 4.8 oz (23.3 kg) Urinalysis: Urine Protein    Urine Glucose    Fetal Status: Fetal Heart Rate (bpm): 130   Movement: Present     Fetus A Non-Stress Test Interpretation for 09/18/2024  Indication: Post Dates  Fetal Heart Rate A Mode: External Variability: Moderate, Minimal Accelerations: 15 x 15 Decelerations: None  Uterine Activity Mode: Toco Contraction Frequency (min): none Resting Tone Palpated: Relaxed  Interpretation (Fetal Testing) Nonstress Test Interpretation: Reactive Overall Impression: Reassuring for gestational age   General:  Alert, oriented and cooperative. Patient is in no acute distress.  Skin: Skin is warm and dry. No rash noted.   Cardiovascular: Normal heart rate noted  Respiratory: Normal respiratory effort, no problems with respiration noted  Abdomen: Soft, gravid, appropriate for gestational age.  Pain/Pressure: Present     Pelvic:  Cervical exam deferred        Extremities: Normal range of motion.  Edema: None  Mental Status: Normal mood and affect. Normal behavior. Normal judgment and thought content.   Assessment   29 y.o. G3P1011 at [redacted]w[redacted]d by  09/16/2024, by Last Menstrual Period presenting for routine prenatal visit  Plan   THIRD Problems (from 02/07/24 to present)     Problem Noted Diagnosed Resolved   Supervision of other normal pregnancy, antepartum 02/07/2024 by Loralyn Sander, CMA  No   Overview Addendum 09/04/2024 10:44 AM by Taft Salines, LPN   Clinical Staff Provider  Office Location  [facility] Dating  LMP  Language  English Anatomy US   Normal; Posterior placenta  Flu Vaccine  07/27/24 Genetic Screen  NIPS: Neg; Female  TDaP vaccine   06/30/24 Hgb A1C or  GTT Early : Not done Third trimester : 101  Covid Denied   LAB RESULTS   Rhogam  B/Positive/-- (06/20 1600)  Blood Type B/Positive/-- (06/20 1600)   RSV 07/27/24 Antibody Negative (06/20 1600)  Feeding Plan Breastfeed Rubella 1.93 (06/20 1600)  Contraception No RPR Non Reactive (06/20 1600)   Circumcision Yes HBsAg Negative (06/20 1600)   Pediatrician  [facility] HIV Non Reactive (06/20 1600)  Support Person Husband Varicella Reactive (06/20 1600)  Prenatal Classes Yes GBS  (For PCN allergy, check sensitivities)     Hep C Non Reactive (06/20 1600)   BTL Consent NA Pap Diagnosis  Date Value Ref Range Status  03/06/2024   Final   - Negative for intraepithelial lesion or malignancy (NILM)    VBAC Consent NA Hgb Electro  Normal 03/06/24    CF      SMA                    Term labor symptoms and general obstetric  precautions including but not limited to vaginal bleeding, contractions, leaking of fluid and fetal movement were reviewed in detail with the patient. Please refer to After Visit Summary for other counseling recommendations.   Return for induction tomorrow morning.  Slater Rains,  CNM 09/18/2024 2:48 PM    "

## 2024-09-18 NOTE — Patient Instructions (Signed)
 Augmentation of Labor Augmentation of labor is when steps are taken to stimulate and strengthen contractions of the uterus during labor. This may be done when contractions have slowed or stopped, and the progress of labor and delivery of the baby is delayed. Before augmentation of labor begins, these factors will be considered: The mother's medical condition and the baby's condition. The size and position of the baby. The size of the birth canal. Tell a health care provider about: Any allergies you have. All medicines you are taking, including vitamins, herbs, eye drops, creams, and over-the-counter medicines. Any problems you or your family members have had with anesthetic medicines. Any surgeries you have had. Any blood disorders you have. Any medical conditions you have. What are the risks? Generally, this is a safe procedure. However, problems may occur, including: Tearing (rupture) of the uterus. Breaking off (abruption) of the placenta. Increased risk of infection for you and your baby. Increased risk of cesarean, forceps, or vacuum delivery. Excessive bleeding after delivery (postpartum hemorrhage). Umbilical cord prolapse. This can cause the umbilical cord to get squeezed during birth. Too much stimulation of the contractions. This can result in continuous, prolonged, or very strong contractions. Your baby could fail to get enough blood flow or oxygen. This can be life-threatening (fetal death). What happens during the procedure? Augmentation of labor may include: Giving you medicine that stimulates contractions (oxytocin). This is given through an IV that is inserted into a vein in your arm. Breaking the fluid-filled sac that surrounds the fetus (amniotic sac). This procedure is called artificial rupture of membranes. This procedure may vary among health care providers and hospitals. Summary Augmentation of labor is when steps are taken to stimulate and strengthen contractions  of the uterus during labor. This may be done when contractions have slowed down or stopped, and the progress of labor and delivery of the baby is delayed. Labor augmentation may be done using medicine to stimulate contractions (oxytocin). Or it can be done by breaking the fluid-filled sac that surrounds the fetus (amniotic sac). Generally, this is a safe procedure. However, problems can occur. Talk with your health care provider about the potential risks and benefits of labor augmentation if this is offered to you. This information is not intended to replace advice given to you by your health care provider. Make sure you discuss any questions you have with your health care provider. Document Revised: 06/16/2020 Document Reviewed: 06/16/2020 Elsevier Patient Education  2025 [employer] Nonstress Test: What to Expect A nonstress test, also called an NST, is done during pregnancy to check your baby's heartbeat. The procedure can help to show if your baby is healthy. It may be done if: Your due date has passed. Your pregnancy is high risk. Your baby is moving less than normal. You've lost a previous pregnancy. Your baby is growing slowly. There's too much or too little fluid around your baby. The NST may be done in the third trimester to find out if it's best for your baby to be born early. During an NST, your baby's heartbeat is watched for at least 20 minutes. If the baby is healthy, the heart rate will go up when the baby moves and will return to normal when the baby rests. This should happen at least twice during the test. Tell a health care provider about: Any allergies you have. Any medical problems you have. All medicines you take. These include vitamins, herbs, eye drops, and creams. Any surgeries you've had. Any past pregnancies  you've had. What are the risks? There are no risks to you or your baby from a nonstress test. This procedure shouldn't be painful or uncomfortable. What  happens before? Eat a meal right before the test or as told by your health care team. Food may help the baby to move. Use the restroom right before the test. What happens during a nonstress test?  Two monitors will be placed on your belly. One will check your baby's heart rate, and the other will check for contractions. You may be asked to lie down on your side or to sit up. You may be given a button to press when you feel your baby move. If your baby seems to be sleeping, you may be asked to drink some juice or soda, eat a snack, or change positions. These steps may vary. Ask what you can expect. What can I expect after? Your team will talk with you about the results and tell you the next steps. If your team gave you any diet or activity instructions, make sure to follow them. Keep all follow-up visits. This is important to check on your health and the health of your baby. This information is not intended to replace advice given to you by your health care provider. Make sure you discuss any questions you have with your health care provider. Document Revised: 08/29/2023 Document Reviewed: 08/29/2023 Elsevier Patient Education  2025 [employer].

## 2024-09-19 ENCOUNTER — Other Ambulatory Visit: Payer: Self-pay

## 2024-09-19 ENCOUNTER — Inpatient Hospital Stay: Admitting: Anesthesiology

## 2024-09-19 ENCOUNTER — Encounter: Payer: Self-pay | Admitting: Certified Nurse Midwife

## 2024-09-19 ENCOUNTER — Inpatient Hospital Stay
Admission: EM | Admit: 2024-09-19 | Discharge: 2024-09-20 | DRG: 807 | Disposition: A | Discharge status: Home / Self Care | Attending: Obstetrics | Admitting: Obstetrics

## 2024-09-19 DIAGNOSIS — Z885 Allergy status to narcotic agent status: Secondary | ICD-10-CM

## 2024-09-19 DIAGNOSIS — Z8249 Family history of ischemic heart disease and other diseases of the circulatory system: Secondary | ICD-10-CM | POA: Diagnosis not present

## 2024-09-19 DIAGNOSIS — Z3A40 40 weeks gestation of pregnancy: Secondary | ICD-10-CM | POA: Diagnosis not present

## 2024-09-19 DIAGNOSIS — O139 Gestational [pregnancy-induced] hypertension without significant proteinuria, unspecified trimester: Secondary | ICD-10-CM | POA: Insufficient documentation

## 2024-09-19 DIAGNOSIS — O99344 Other mental disorders complicating childbirth: Secondary | ICD-10-CM

## 2024-09-19 DIAGNOSIS — F419 Anxiety disorder, unspecified: Secondary | ICD-10-CM | POA: Diagnosis present

## 2024-09-19 DIAGNOSIS — O134 Gestational [pregnancy-induced] hypertension without significant proteinuria, complicating childbirth: Secondary | ICD-10-CM | POA: Diagnosis present

## 2024-09-19 DIAGNOSIS — F418 Other specified anxiety disorders: Secondary | ICD-10-CM

## 2024-09-19 DIAGNOSIS — Z91040 Latex allergy status: Secondary | ICD-10-CM

## 2024-09-19 DIAGNOSIS — O480 Post-term pregnancy: Principal | ICD-10-CM

## 2024-09-19 DIAGNOSIS — Z3480 Encounter for supervision of other normal pregnancy, unspecified trimester: Principal | ICD-10-CM

## 2024-09-19 DIAGNOSIS — Z91013 Allergy to seafood: Secondary | ICD-10-CM

## 2024-09-19 DIAGNOSIS — Z833 Family history of diabetes mellitus: Secondary | ICD-10-CM

## 2024-09-19 LAB — CBC
HCT: 38.4 % (ref 36.0–46.0)
Hemoglobin: 13.1 g/dL (ref 12.0–15.0)
MCH: 29.4 pg (ref 26.0–34.0)
MCHC: 34.1 g/dL (ref 30.0–36.0)
MCV: 86.1 fL (ref 80.0–100.0)
Platelets: 296 K/uL (ref 150–400)
RBC: 4.46 MIL/uL (ref 3.87–5.11)
RDW: 13.2 % (ref 11.5–15.5)
WBC: 11.2 K/uL — ABNORMAL HIGH (ref 4.0–10.5)
nRBC: 0 % (ref 0.0–0.2)

## 2024-09-19 LAB — COMPREHENSIVE METABOLIC PANEL WITH GFR
ALT: 23 U/L (ref 0–44)
AST: 29 U/L (ref 15–41)
Albumin: 4.2 g/dL (ref 3.5–5.0)
Alkaline Phosphatase: 109 U/L (ref 38–126)
Anion gap: 13 (ref 5–15)
BUN: 7 mg/dL (ref 6–20)
CO2: 22 mmol/L (ref 22–32)
Calcium: 9.5 mg/dL (ref 8.9–10.3)
Chloride: 102 mmol/L (ref 98–111)
Creatinine, Ser: 0.52 mg/dL (ref 0.44–1.00)
GFR, Estimated: >60 mL/min
Glucose, Bld: 72 mg/dL (ref 70–99)
Potassium: 3.8 mmol/L (ref 3.5–5.1)
Sodium: 137 mmol/L (ref 135–145)
Total Bilirubin: 0.2 mg/dL (ref 0.0–1.2)
Total Protein: 7.2 g/dL (ref 6.5–8.1)

## 2024-09-19 LAB — TYPE AND SCREEN
ABO/RH(D): B POS
Antibody Screen: NEGATIVE

## 2024-09-19 LAB — PROTEIN / CREATININE RATIO, URINE
Creatinine, Urine: 85 mg/dL
Protein Creatinine Ratio: 0.1 mg/mg
Total Protein, Urine: 8 mg/dL

## 2024-09-19 LAB — ABO/RH: ABO/RH(D): B POS

## 2024-09-19 MED ORDER — ACETAMINOPHEN 325 MG PO TABS: 650.0000 mg | ORAL_TABLET | ORAL | Status: DC | PRN

## 2024-09-19 MED ORDER — OXYTOCIN-SODIUM CHLORIDE 30-0.9 UT/500ML-% IV SOLN: 2.5000 [IU]/h | INTRAVENOUS | Status: DC

## 2024-09-19 MED ORDER — MISOPROSTOL 200 MCG PO TABS
ORAL_TABLET | ORAL | Status: AC
  Filled 2024-09-19: qty 4

## 2024-09-19 MED ORDER — TETANUS-DIPHTH-ACELL PERTUSSIS 5-2-15.5 LF-MCG/0.5 IM SUSP: 0.5000 mL | Freq: Once | INTRAMUSCULAR | Status: DC

## 2024-09-19 MED ORDER — LIDOCAINE HCL (PF) 1 % IJ SOLN
INTRAMUSCULAR | Status: DC | PRN
  Administered 2024-09-19: 4 mL via SUBCUTANEOUS

## 2024-09-19 MED ORDER — TERBUTALINE SULFATE 1 MG/ML IJ SOLN: 0.2500 mg | Freq: Once | INTRAMUSCULAR | Status: DC | PRN

## 2024-09-19 MED ORDER — DIPHENHYDRAMINE HCL 25 MG PO CAPS: 25.0000 mg | ORAL_CAPSULE | Freq: Four times a day (QID) | ORAL | Status: DC | PRN

## 2024-09-19 MED ORDER — DIPHENHYDRAMINE HCL 50 MG/ML IJ SOLN: 12.5000 mg | INTRAMUSCULAR | Status: DC | PRN

## 2024-09-19 MED ORDER — LACTATED RINGERS IV SOLN
500.0000 mL | Freq: Once | INTRAVENOUS | Status: AC
  Administered 2024-09-19: 500 mL via INTRAVENOUS

## 2024-09-19 MED ORDER — OXYCODONE-ACETAMINOPHEN 5-325 MG PO TABS: 1.0000 | ORAL_TABLET | ORAL | Status: DC | PRN

## 2024-09-19 MED ORDER — BENZOCAINE-MENTHOL 20-0.5 % EX AERO
1.0000 | INHALATION_SPRAY | CUTANEOUS | Status: DC | PRN
  Administered 2024-09-19: 1 via TOPICAL
  Filled 2024-09-19 (×2): qty 56

## 2024-09-19 MED ORDER — OXYTOCIN BOLUS FROM INFUSION
333.0000 mL | Freq: Once | INTRAVENOUS | Status: AC
  Administered 2024-09-19: 333 mL via INTRAVENOUS

## 2024-09-19 MED ORDER — ZOLPIDEM TARTRATE 5 MG PO TABS: 5.0000 mg | ORAL_TABLET | Freq: Every evening | ORAL | Status: DC | PRN

## 2024-09-19 MED ORDER — WITCH HAZEL-GLYCERIN EX PADS
MEDICATED_PAD | CUTANEOUS | Status: DC | PRN
  Filled 2024-09-19: qty 100

## 2024-09-19 MED ORDER — OXYTOCIN-SODIUM CHLORIDE 30-0.9 UT/500ML-% IV SOLN
1.0000 m[IU]/min | INTRAVENOUS | Status: DC
  Administered 2024-09-19: 2 m[IU]/min via INTRAVENOUS

## 2024-09-19 MED ORDER — PRENATAL MULTIVITAMIN CH: 1.0000 | ORAL_TABLET | Freq: Every day | ORAL | Status: DC

## 2024-09-19 MED ORDER — IBUPROFEN 600 MG PO TABS
600.0000 mg | ORAL_TABLET | Freq: Four times a day (QID) | ORAL | Status: DC
  Administered 2024-09-20 (×4): 600 mg via ORAL
  Filled 2024-09-19 (×3): qty 1

## 2024-09-19 MED ORDER — PHENYLEPHRINE 80 MCG/ML (10ML) SYRINGE FOR IV PUSH (FOR BLOOD PRESSURE SUPPORT): 80.0000 ug | PREFILLED_SYRINGE | INTRAVENOUS | Status: DC | PRN

## 2024-09-19 MED ORDER — SIMETHICONE 80 MG PO CHEW: 80.0000 mg | CHEWABLE_TABLET | ORAL | Status: DC | PRN

## 2024-09-19 MED ORDER — LACTATED RINGERS IV SOLN: 500.0000 mL | INTRAVENOUS | Status: DC | PRN

## 2024-09-19 MED ORDER — FENTANYL-BUPIVACAINE-NACL 0.5-0.125-0.9 MG/250ML-% EP SOLN
12.0000 mL/h | EPIDURAL | Status: DC | PRN
  Administered 2024-09-19: 12 mL/h via EPIDURAL
  Filled 2024-09-19: qty 250

## 2024-09-19 MED ORDER — OXYTOCIN-SODIUM CHLORIDE 30-0.9 UT/500ML-% IV SOLN: 2.5000 [IU]/h | INTRAVENOUS | Status: DC | PRN

## 2024-09-19 MED ORDER — OXYTOCIN 10 UNIT/ML IJ SOLN
INTRAMUSCULAR | Status: AC
  Filled 2024-09-19: qty 2

## 2024-09-19 MED ORDER — FENTANYL CITRATE (PF) 100 MCG/2ML IJ SOLN: 50.0000 ug | INTRAMUSCULAR | Status: DC | PRN

## 2024-09-19 MED ORDER — AMMONIA AROMATIC IN INHA
RESPIRATORY_TRACT | Status: AC
  Filled 2024-09-19: qty 10

## 2024-09-19 MED ORDER — SOD CITRATE-CITRIC ACID 500-334 MG/5ML PO SOLN: 30.0000 mL | ORAL | Status: DC | PRN

## 2024-09-19 MED ORDER — EPHEDRINE 5 MG/ML INJ: 10.0000 mg | INTRAVENOUS | Status: DC | PRN

## 2024-09-19 MED ORDER — LIDOCAINE HCL (PF) 1 % IJ SOLN
INTRAMUSCULAR | Status: AC
  Filled 2024-09-19: qty 30

## 2024-09-19 MED ORDER — LIDOCAINE-EPINEPHRINE (PF) 1.5 %-1:200000 IJ SOLN
INTRAMUSCULAR | Status: DC | PRN
  Administered 2024-09-19: 4 mL via EPIDURAL

## 2024-09-19 MED ORDER — DOCUSATE SODIUM 100 MG PO CAPS
100.0000 mg | ORAL_CAPSULE | Freq: Two times a day (BID) | ORAL | Status: DC
  Administered 2024-09-20 (×2): 100 mg via ORAL
  Filled 2024-09-19 (×2): qty 1

## 2024-09-19 MED ORDER — SODIUM CHLORIDE 0.9 % IV SOLN
INTRAVENOUS | Status: DC | PRN
  Administered 2024-09-19: 5 mL via EPIDURAL
  Administered 2024-09-19: 3 mL via EPIDURAL

## 2024-09-19 MED ORDER — LACTATED RINGERS IV SOLN: INTRAVENOUS | Status: DC

## 2024-09-19 MED ORDER — ONDANSETRON HCL 4 MG/2ML IJ SOLN: 4.0000 mg | Freq: Four times a day (QID) | INTRAMUSCULAR | Status: DC | PRN

## 2024-09-19 MED ORDER — LIDOCAINE HCL (PF) 1 % IJ SOLN: 30.0000 mL | INTRAMUSCULAR | Status: DC | PRN

## 2024-09-19 NOTE — Anesthesia Procedure Notes (Signed)
 Epidural Patient location during procedure: OB End time: 09/19/2024 4:48 PM  Staffing Performed: anesthesiologist   Preanesthetic Checklist Completed: patient identified, IV checked, site marked, risks and benefits discussed, surgical consent, monitors and equipment checked, pre-op evaluation and timeout performed  Epidural Patient position: sitting Prep: Betadine Patient monitoring: heart rate, continuous pulse ox and blood pressure Approach: midline Location: L4-L5 Injection technique: LOR saline  Needle:  Needle type: Tuohy  Needle gauge: 17 G Needle length: 9 cm and 9 Needle insertion depth: 5 cm Catheter type: closed end flexible Catheter size: 19 Gauge Catheter at skin depth: 11 cm Test dose: negative and 1.5% lidocaine  with Epi 1:200 K  Assessment Events: blood not aspirated, no cerebrospinal fluid, injection not painful, no injection resistance, no paresthesia and negative IV test  Additional Notes   Patient tolerated the insertion well without complications.Reason for block:procedure for pain

## 2024-09-19 NOTE — Anesthesia Preprocedure Evaluation (Signed)
Anesthesia Evaluation  Patient identified by MRN, date of birth, ID band Patient awake    Reviewed: Allergy & Precautions, H&P , NPO status , Patient's Chart, lab work & pertinent test results, reviewed documented beta blocker date and time   Airway Mallampati: II  TM Distance: >3 FB Neck ROM: full    Dental no notable dental hx. (+) Teeth Intact   Pulmonary neg pulmonary ROS   Pulmonary exam normal breath sounds clear to auscultation       Cardiovascular Exercise Tolerance: Good negative cardio ROS  Rhythm:regular Rate:Normal     Neuro/Psych   Anxiety Depression    negative neurological ROS  negative psych ROS   GI/Hepatic negative GI ROS, Neg liver ROS,,,  Endo/Other  negative endocrine ROSdiabetes, Gestational    Renal/GU      Musculoskeletal   Abdominal   Peds  Hematology negative hematology ROS (+)   Anesthesia Other Findings   Reproductive/Obstetrics (+) Pregnancy                             Anesthesia Physical Anesthesia Plan  ASA: 2  Anesthesia Plan: Epidural   Post-op Pain Management:    Induction:   PONV Risk Score and Plan:   Airway Management Planned:   Additional Equipment:   Intra-op Plan:   Post-operative Plan:   Informed Consent: I have reviewed the patients History and Physical, chart, labs and discussed the procedure including the risks, benefits and alternatives for the proposed anesthesia with the patient or authorized representative who has indicated his/her understanding and acceptance.       Plan Discussed with:   Anesthesia Plan Comments:        Anesthesia Quick Evaluation

## 2024-09-19 NOTE — Progress Notes (Cosign Needed Addendum)
 Labor Progress Note   ASSESSMENT/PLAN   Adrianne Shackleton 29 y.o.   G3P1011  at [redacted]w[redacted]d here for IOL for Post dates.  FWB:  - Fetal well being assessed: Cat 1      GBS: - GBS Negative/-- (12/10 1443)   LABOR: - Now in early labor, doing well. - Pain Management: non pharmacological, movement with birthing ball - Discussed options with patient and would like epidural in the future, would likely avoid IV fentanyl  d/t reactions to opioids in the past - Anticipate SVD   Active Problems: Anxiety & Depression - Managing with support person at bedside during labor - Discussed pain management options - Not on any current medications  Labor Progress:  -Pitocin  started  Cervical Exam: 3 / 50 / -2  @ 1031 -Amniotomy clear fluid -Cervical exam: 4/80/-2 @ 1442  SUBJECTIVE/OBJECTIVE   SUBJECTIVE: Patient report noticing contractions and rates pain 1-2, is able to talk through contractions.    OBJECTIVE: Vital Signs: Patient Vitals for the past 12 hrs:  BP Temp Temp src Pulse Resp Height Weight  09/19/24 1449 -- 97.6 F (36.4 C) Oral -- -- -- --  09/19/24 1400 (!) 147/97 -- -- 99 -- -- --  09/19/24 1300 136/78 -- -- 93 -- -- --  09/19/24 1131 127/84 97.8 F (36.6 C) Oral 82 -- -- --  09/19/24 1020 136/78 -- -- 92 -- -- --  09/19/24 0907 -- -- -- -- -- 5' 7 (1.702 m) 86.8 kg  09/19/24 0838 (!) 148/90 97.7 F (36.5 C) Oral 90 16 -- --    Last SVE:  Dilation: 4 Effacement (%): 80 Cervical Position: Posterior Station: -2 Presentation: Vertex Exam by:: Guillen -  , Rupture Date: 09/19/24, Rupture Time: 1443,    FHR:   - Mode: External  - Baseline Rate (A): 140 bpm  -  moderate Variability  - Characteristics (ie - accels, decels): Accelerations: 15 x 15, no decelerations  UTERINE ACTIVITY:   - Mode: Toco  - Contraction Frequency (min): occ, irregular Pitocin : 16 mU/min

## 2024-09-19 NOTE — Discharge Summary (Signed)
 "    Postpartum Discharge Summary  Date of Service updated***     Patient Name: Caroline Gonzalez DOB: Oct 20, 1995 MRN: [id number]  Date of admission: 09/19/2024 Delivery date:09/19/2024 Delivering provider: MACY PERKINS Date of discharge: 09/19/2024  Admitting diagnosis: Labor and delivery, indication for care [O75.9] Intrauterine pregnancy: [redacted]w[redacted]d     Secondary diagnosis:  Principal Problem:   Labor and delivery, indication for care  Additional problems: ***    Discharge diagnosis: {DX.:23714}                                              Post partum procedures:{Postpartum procedures:23558} Augmentation: AROM and Pitocin  Complications: None  Hospital course: Induction of Labor With Vaginal Delivery   29 y.o. yo G3P1011 at [redacted]w[redacted]d was admitted to the hospital 09/19/2024 for induction of labor.  Indication for induction: Postdates.  Patient had an labor course complicated by none Membrane Rupture Time/Date: 2:43 PM,09/19/2024  Delivery Method:Vaginal, Spontaneous Operative Delivery:N/A Episiotomy: None Lacerations:  None Details of delivery can be found in separate delivery note.  Patient had a postpartum course complicated by***. Patient is discharged home 09/19/2024.  Newborn Data: Birth date:09/19/2024 Birth time:7:26 PM Gender:Female Living status:Living Apgars:9 ,9  Weight:   Magnesium Sulfate received: No BMZ received: No Rhophylac:N/A MMR:No T-DaP:Given prenatally Flu: Yes RSV Vaccine received: Yes Transfusion:{Transfusion received:30440034} Immunizations administered: Immunization History  Administered Date(s) Administered    sv, Bivalent, Protein Subunit Rsvpref,pf (Abrysvo) 07/27/2024   DTaP 10/01/1995, 12/03/1995, 02/14/1996, 12/09/1996, 12/18/2000   HIB (PRP-OMP) 10/01/1995, 12/03/1995, 02/14/1996, 12/09/1996   Hepatitis A 03/10/2008, 10/07/2008   Hepatitis B 05/10/95, 10/01/1995, 06/15/1996   Hpv-Unspecified 03/10/2008, 10/07/2008, 05/16/2009   IPV  10/04/1995, 12/03/1995, 02/14/1996, 01/14/2001   Influenza Split 07/18/2012   Influenza Whole 07/28/2010   Influenza, Seasonal, Injecte, Preservative Fre 07/27/2024   Influenza,inj,Quad PF,6+ Mos 06/08/2013, 08/09/2015, 06/19/2016, 08/01/2018   Influenza-Unspecified 06/18/2017   MMR 08/14/1996, 12/18/2000   Meningococcal polysaccharide vaccine (MPSV4) 03/05/2008   Td 11/23/2019   Tdap 04/11/2007, 06/30/2024   Varicella 08/14/1996, 03/05/2008    Physical exam  Vitals:   09/19/24 2009 09/19/24 2019 09/19/24 2033 09/19/24 2034  BP:  123/66 139/76   Pulse:  93 95   Resp:      Temp:      TempSrc:      SpO2: 97% 97%  98%  Weight:      Height:       General: {Exam; general:21111117} Lochia: {Desc; appropriate/inappropriate:30686::appropriate} Uterine Fundus: {Desc; firm/soft:30687} Incision: {Exam; incision:21111123} DVT Evaluation: {Exam; dvt:2111122} Labs: Lab Results  Component Value Date   WBC 11.2 (H) 09/19/2024   HGB 13.1 09/19/2024   HCT 38.4 09/19/2024   MCV 86.1 09/19/2024   PLT 296 09/19/2024      Latest Ref Rng & Units 09/19/2024   10:02 AM  CMP  Glucose 70 - 99 mg/dL 72   BUN 6 - 20 mg/dL 7   Creatinine 9.55 - 8.99 mg/dL 9.47   Sodium 864 - 854 mmol/L 137   Potassium 3.5 - 5.1 mmol/L 3.8   Chloride 98 - 111 mmol/L 102   CO2 22 - 32 mmol/L 22   Calcium 8.9 - 10.3 mg/dL 9.5   Total Protein 6.5 - 8.1 g/dL 7.2   Total Bilirubin 0.0 - 1.2 mg/dL 0.2   Alkaline Phos 38 - 126 U/L 109   AST 15 - 41 U/L  29   ALT 0 - 44 U/L 23    Edinburgh Score:    03/06/2024    3:35 PM  Edinburgh Postnatal Depression Scale Screening Tool  I have been able to laugh and see the funny side of things. 0   I have looked forward with enjoyment to things. 0   I have blamed myself unnecessarily when things went wrong. 2   I have been anxious or worried for no good reason. 2   I have felt scared or panicky for no good reason. 2   Things have been getting on top of me. 1   I have  been so unhappy that I have had difficulty sleeping. 0   I have felt sad or miserable. 1   I have been so unhappy that I have been crying. 1   The thought of harming myself has occurred to me. 0   Edinburgh Postnatal Depression Scale Total 9     Data saved with a previous flowsheet row definition      After visit meds:  Allergies as of 09/19/2024       Reactions   Codeine    REACTION: hallucinations   Latex Hives   Morphine    REACTION: hallucinations   Shrimp [shellfish Allergy] Other (See Comments)   Vomiting, fever   Sulfa Antibiotics    Tamiflu  [oseltamivir  Phosphate]      Med Rec must be completed prior to using this SMARTLINK***        Discharge home in stable condition Infant Feeding: {Baby feeding:23562} Infant Disposition:{CHL IP OB HOME WITH FNUYZM:76418} Discharge instruction: per After Visit Summary and Postpartum booklet. Activity: Advance as tolerated. Pelvic rest for 6 weeks.  Diet: {OB diet:21111121} Anticipated Birth Control: {Birth Control:23956} Postpartum Appointment:6 weeks Additional Postpartum F/U: Postpartum Depression checkup Future Appointments:No future appointments. Follow up Visit:  Follow-up Information     Slaughterbeck, Damien, [place]. Schedule an appointment as soon as possible for a visit today.   Specialty: Certified Nurse Midwife Why: Two week virtual visit, 6 week postpartum Contact information: [address] [place] [address] [phone]                     09/19/2024 Damien PARSLEY, CNM   "

## 2024-09-19 NOTE — Plan of Care (Signed)

## 2024-09-19 NOTE — H&P (Signed)
 [facility] Labor & Delivery  History and Physical  ASSESSMENT AND PLAN   Caroline Gonzalez is a 29 y.o. G3P1011 at [redacted]w[redacted]d with EDD: 09/16/2024, by Last Menstrual Period admitted for IOL for postdates  Induction of Labor - Initiate Pitocin  titrate as needed - Continuous EFM - Pain management plan:epidural   Fetal Status: - cephalic presentation by ultrasound - EFW: 7 by palpation - Continuous - FHT currently cat 1  Anxiety and Depression - 2 week post partum follow up visit  Labs/Immunizations: Prenatal labs and studies: ABO, Rh: B/Positive/-- (06/20 1600) Antibody: Negative (06/20 1600) Rubella: 1.93 (06/20 1600) Varicella: Reactive (06/20 1600)  RPR: Non Reactive (10/14 0928)  HBsAg: Negative (06/20 1600)  HepC: Non Reactive (06/20 1600) HIV: Non Reactive (10/14 0928)  GBS: Negative/-- (12/10 1443)   TDAP: 06/30/2024 Flu: 07/27/2024 RSV: yes 08/11/2024  Postpartum Plan: - Feeding: Breast Milk - Contraception: plans none - Prenatal Care Provider: CNM     HPI   Chief Complaint: Patient here for induction of Labor  Caroline Gonzalez is a 29 y.o. G3P1011 at [redacted]w[redacted]d who presents for induction of labor for postdates  Patient denies bleeding, leaking of fluid, headache, blurry vision or right upper quadrant pain.   Pregnancy Complications Patient Active Problem List   Diagnosis Date Noted   Labor and delivery, indication for care 09/19/2024   Supervision of other normal pregnancy, antepartum 02/07/2024   History of 1 spontaneous abortion 02/07/2024   Anxiety and depression 04/03/2018    Review of Systems A twelve point review of systems was negative except as stated in HPI.   HISTORY   Medications Medications Prior to Admission  Medication Sig Dispense Refill Last Dose/Taking   folic acid (FOLVITE) 1 MG tablet Take 1 mg by mouth daily.   09/18/2024   Prenatal Vit-Fe Fumarate-FA (PRENATAL PO) Take by mouth daily.   09/18/2024     Allergies is allergic to codeine, latex, morphine, shrimp [shellfish allergy], sulfa antibiotics, and tamiflu  [oseltamivir  phosphate].   OB History OB History  Gravida Para Term Preterm AB Living  3 1 1  0 1 1  SAB IAB Ectopic Multiple Live Births  1 0 0 0 1    # Outcome Date GA Lbr Len/2nd Weight Sex Type Anes PTL Lv  3 Current           2 SAB 09/02/21          1 Term 09/06/14 104w0d  3147 g CHRISTELLA Vag-Spont   LIV     Name: Caroline Gonzalez    Past Medical History Past Medical History:  Diagnosis Date   Anxiety    Contraception    Depression    Sleep disturbance     Past Surgical History Past Surgical History:  Procedure Laterality Date   TONSILLECTOMY     WISDOM TOOTH EXTRACTION      Social History  reports that she has never smoked. She has never used smokeless tobacco. She reports that she does not currently use alcohol. She reports that she does not use drugs.   Family History family history includes Breast cancer in her paternal aunt, paternal aunt, paternal aunt, and paternal grandmother; Diabetes in her mother and paternal grandfather; Hearing loss in her maternal aunt and maternal uncle; Heart disease in her maternal grandfather and maternal grandmother; Hypertension in her mother.   PHYSICAL EXAM   Vitals:   09/19/24 0907  Weight: 86.8 kg  Height: 5' 7 (1.702 m)    Constitutional: No  acute distress, well appearing, and well nourished. Neurologic: She is alert and conversational.  Psychiatric: She has a normal mood and affect.  Musculoskeletal: Normal gait, grossly normal range of motion Cardiovascular: Normal rate.   Pulmonary/Chest: Normal work of breathing.  Gastrointestinal/Abdominal: Soft. Gravid. There is no tenderness.  Skin: Skin is warm and dry. No rash noted.  Genitourinary: Normal external female genitalia.  SVE:   Dilation: 3 Effacement (%): 50 Cervical Position: Posterior Station: -2 Presentation: Vertex Exam by:: Guillen SNM   NST  Interpretation Indication: IOL Baseline: 130 bpm Variability: moderate Accelerations: present Decelerations: none Contractions: none Time noted:  See OBIX Impression: reactive Authenticated by: Leita LILLETTE Camillia Adrien   Attending Emily Slaughterbeck, CNM was immediately available for the care of the patient.   Leita LILLETTE Camillia Adrien, Student-MidWife

## 2024-09-20 DIAGNOSIS — O139 Gestational [pregnancy-induced] hypertension without significant proteinuria, unspecified trimester: Secondary | ICD-10-CM | POA: Insufficient documentation

## 2024-09-20 LAB — SYPHILIS: RPR W/REFLEX TO RPR TITER AND TREPONEMAL ANTIBODIES, TRADITIONAL SCREENING AND DIAGNOSIS ALGORITHM: RPR Ser Ql: NONREACTIVE

## 2024-09-20 NOTE — Progress Notes (Deleted)
 Discharge Instructions: Call this AM for follow-up appointment. Two week virtual and six week in person.  Follow-up Appointment:  If there are any new medications, they have been ordered and will be available for pickup at the listed pharmacy on your way home from the hospital.   Call office if you have any of the following: headache, visual changes, fever >101.0 F, chills, shortness of breath, breast concerns, excessive vaginal bleeding, incision drainage or problems, leg pain or redness, depression or any other concerns. If you have vaginal discharge with an odor, let your doctor know.   It is normal to bleed for up to 6 weeks. You should not soak through more than 1 pad in 1 hour. If you have a blood clot larger than your fist with continued bleeding, call your doctor.   Activity: Do not lift > 10 lbs for 6 weeks (do not lift anything heavier than your baby). No intercourse, tampons, swimming pools, hot tubs, baths (only showers) for 6 weeks.  No driving for 1-2 weeks. Continue prenatal vitamin, especially if breastfeeding. Increase calories and fluids (water) while breastfeeding.   Your milk will come in, in the next couple of days (right now it is colostrum). You may have a slight fever when your milk comes in, but it should go away on its own.  If it does not, and rises above 101 F please call the doctor. You will also feel achy and your breasts will be firm. They will also start to leak. If you are breastfeeding, continue as you have been and you can pump/express milk for comfort.   If you have too much milk, your breasts can become engorged, which could lead to mastitis. This is an infection of the milk ducts. It can be very painful and you will need to notify your doctor to obtain a prescription for antibiotics. You can also treat it with a shower or hot/cold compress.   For concerns about your baby, please call your pediatrician.  For breastfeeding concerns, the lactation consultant  can be reached at [phone].   Postpartum blues (feelings of happy one minute and sad another minute) are normal for the first few weeks but if it gets worse let your doctor know.   Congratulations! We enjoyed caring for you and your new bundle of joy!

## 2024-09-20 NOTE — Anesthesia Postprocedure Evaluation (Signed)
"   Anesthesia Post Note  Patient: Caroline Gonzalez  Procedure(s) Performed: AN AD HOC LABOR EPIDURAL  Patient location during evaluation: Mother Baby Anesthesia Type: Epidural Level of consciousness: awake and alert Pain management: pain level controlled Vital Signs Assessment: post-procedure vital signs reviewed and stable Respiratory status: spontaneous breathing, nonlabored ventilation and respiratory function stable Cardiovascular status: stable Postop Assessment: no headache, no backache and able to ambulate Anesthetic complications: no   No notable events documented.   Last Vitals:  Vitals:   09/20/24 0828 09/20/24 1158  BP: 127/84 136/81  Pulse: 95 97  Resp: 18 19  Temp: 37 C 37 C  SpO2: 97%     Last Pain:  Vitals:   09/20/24 1158  TempSrc: Oral  PainSc:                  Prentice Murphy      "

## 2024-09-20 NOTE — Progress Notes (Signed)
 Post Partum Day 0 Subjective: Caroline Gonzalez is feeling well overall. She is ambulating, voiding, and tolerating POs without difficulty. Her pain is well-controlled and her bleeding is WNL. She denies HA, visual changes, and epigastric pain. Her mood is stable. Breastfeeding is going well. She is hoping to be discharged this evening.  Objective: Blood pressure 127/84, pulse 95, temperature 98.6 F (37 C), temperature source Oral, resp. rate 18, height 5' 7 (1.702 m), weight 86.8 kg, last menstrual period 12/11/2023, SpO2 97%, unknown if currently breastfeeding.  Physical Exam:  General: alert, cooperative, and appears stated age Lochia: appropriate Uterine Fundus: firm Perineum: healing well DVT Evaluation: No evidence of DVT seen on physical exam.  Recent Labs    09/19/24 1001  HGB 13.1  HCT 38.4    Assessment/Plan: Will continue to monitor BPs throughout the day. Caroline Gonzalez strongly desires d/c tonight. I discussed my recommendation to stay overnight d/t GHTN diagnosis. Consider d/c this evening if BPs are all WNL.   LOS: 1 day   Caroline Gonzalez, CNM 09/20/2024, 11:22 AM

## 2024-09-20 NOTE — Final Progress Note (Signed)
 Post Partum Day 1 Subjective: Caroline Gonzalez's has been normotensive today. She denies HA, visual changes, and epigastric pain. She strongly desires discharge this evening. She is able to come to the office for a BP check on Tuesday. We have discussed the s/s of severe HTN and the indications for returning emergently to the hospital, and she is in agreement.  Objective: Blood pressure 132/79, pulse 85, temperature 98.2 F (36.8 C), temperature source Oral, resp. rate 18, height 5' 7 (1.702 m), weight 86.8 kg, last menstrual period 12/11/2023, SpO2 99%, unknown if currently breastfeeding.    Assessment/Plan: Discharge home BP check in 2 days Video visit in 2 weeks Office visit in 6 weeks Discharge instructions and danger signs reviewed   LOS: 1 day   Caroline Gonzalez, CNM 09/20/2024, 8:48 PM

## 2024-09-20 NOTE — Discharge Instructions (Signed)

## 2024-09-20 NOTE — Lactation Note (Signed)
 This note was copied from a baby's chart. Lactation Consultation Note  Patient Name: Caroline Gonzalez Date: 09/20/2024 Age:30 hours    **LC informed by RN that patient does not want to see lactation at all unless she calls out. No initial consult or any information given to patient at this time. **      Jona Erkkila S Arleny Kruger 09/20/2024, 11:28 AM

## 2024-09-20 NOTE — Anesthesia Post-op Follow-up Note (Signed)
" °  Anesthesia Pain Follow-up Note  Patient: Caroline Gonzalez  Day #: 1  Date of Follow-up: 09/20/2024 Time: 4:10 PM  Last Vitals:  Vitals:   09/20/24 0828 09/20/24 1158  BP: 127/84 136/81  Pulse: 95 97  Resp: 18 19  Temp: 37 C 37 C  SpO2: 97%     Level of Consciousness: alert  Pain: mild   Side Effects:None  Catheter Site Exam:clean, dry     Plan: D/C from anesthesia care at surgeon's request  Prentice Murphy     "

## 2024-09-21 ENCOUNTER — Telehealth: Payer: Self-pay | Admitting: Certified Nurse Midwife

## 2024-09-21 NOTE — Patient Instructions (Signed)
 High Blood Pressure After Pregnancy: What to Know High blood pressure after pregnancy, or postpartum hypertension, is blood pressure that is higher than normal after childbirth. It's most common within 1 to 2 days after delivery, but it can happen later. Sometimes, it can happen up to 12 weeks or more after pregnancy. Some people have to have medical treatment to control high blood pressure and prevent serious complications. What are the causes? The cause of this condition is not well understood. In some cases, the cause may not be known. Certain conditions may increase your risk. These include: Hypertension that existed before pregnancy (chronic hypertension). Hypertension that happens as a result of pregnancy (gestational hypertension). Hypertensive disorders during pregnancy or seizures in women who have high blood pressure during pregnancy. These conditions are called pre-eclampsia and eclampsia. A condition in which the liver, platelets, and red blood cells are damaged during pregnancy (HELLP syndrome). Obesity. Diabetes. What increases the risk? If you had blood pressure problems during pregnancy, you're more likely to get this condition after giving birth. However, you can still have high blood pressure after delivery even if you didn't have problems during pregnancy. What are the signs or symptoms? Signs and symptoms may include: Headaches. These may be mild, moderate, or severe. They may also be steady, constant, or sudden (thunderclap headache). Vision changes, such as blurry vision, flashing lights, or seeing spots. Nausea and vomiting. Pain in the upper right side of your abdomen. Shortness of breath or trouble breathing. Swelling in your face or hands. A decrease in the amount of urine that you pass. You may not have any signs or symptoms. How is this diagnosed? This condition may be diagnosed based on the results of a physical exam, blood pressure measurements, and blood and pee  (urine) tests. You may also have other tests, such as a CT scan or an MRI, to check for other problems. How is this treated? If blood pressure is high enough to require treatment, your options may include: Medicines to reduce blood pressure (antihypertensives). Tell your health care provider if you are breastfeeding or if you plan to breastfeed. There are many antihypertensive medicines that are safe to take while breastfeeding. Treating medical conditions that are causing hypertension. Treating the complications of hypertension, such as seizures, stroke, or kidney problems. Your health care provider will also continue to closely watch your blood pressure. Follow these instructions at home: Learn your goal blood pressure Two numbers make up your blood pressure. The first number is called systolic pressure. The second is called diastolic pressure. An example of a blood pressure reading is "120 over 80" (or 120/80). Ask your health care provider what your goal blood pressure is. Know how to take your blood pressure To check your blood pressure, follow the instructions in the manual that came with your blood pressure monitor. This includes any instructions on what to do before taking your blood pressure. Record your blood pressure readings Follow your health care provider's instructions on how to record your blood pressure readings. Your health care provider may ask you to: Get one reading in the morning (a.m.) before you take any medicines. Get one reading in the evening (p.m.) before supper. Take at least 2 readings with each blood pressure check. This makes sure the results are correct. Wait 1-2 minutes between measurements.  General instructions Take over-the-counter and prescription medicines only as told by your health care provider. Do not use any products that contain nicotine or tobacco. These products include cigarettes, chewing  tobacco, and vaping devices, such as e-cigarettes. If you  need help quitting, ask your health care provider. Check your blood pressure as often as told by your health care provider. Return to your normal activities as told by your health care provider. Ask your health care provider what activities are safe for you. Keep all follow-up visits. Your health care provider will continue to closely watch your blood pressure. Contact a health care provider if: You have new symptoms, such as: A headache that does not get better. Dizziness. Vision changes. Nausea and vomiting. Get help right away if: You have trouble breathing. You have chest pain. You faint. You have any symptoms of a stroke. "BE FAST" is an easy way to remember the main warning signs of a stroke: B - Balance. Signs are dizziness, sudden trouble walking, or loss of balance. E - Eyes. Signs are trouble seeing or a sudden change in vision. F - Face. Signs are sudden weakness or numbness of the face, or the face or eyelid drooping on one side. A - Arms. Signs are weakness or numbness in an arm. This happens suddenly and usually on one side of the body. S - Speech. Signs are sudden trouble speaking, slurred speech, or trouble understanding what people say. T - Time. Time to call emergency services. Write down what time symptoms started. You have other signs of a stroke, such as: A sudden, severe headache with no known cause. Nausea or vomiting. Seizure. These symptoms may be an emergency. Get help right away. Call 911. Do not wait to see if the symptoms will go away. Do not drive yourself to the hospital. This information is not intended to replace advice given to you by your health care provider. Make sure you discuss any questions you have with your health care provider. Document Revised: 04/17/2023 Document Reviewed: 12/05/2021 Elsevier Patient Education  2024 [employer].

## 2024-09-21 NOTE — Telephone Encounter (Signed)
-----   Message from [name] sent at 09/20/2024  8:44 PM EST ----- Regarding: PP follow up Hi -   Karlina needs a BP check on Tuesday, and then a video visit in 2 weeks and office visit in 6 weeks with Damien.  Thanks!  Caroline Gonzalez

## 2024-09-21 NOTE — Telephone Encounter (Signed)
 Contacted the patient via phone, left message for the patient to contact the office for scheduling.

## 2024-09-21 NOTE — Progress Notes (Signed)
" ° ° °  NURSE VISIT NOTE  Subjective:    Patient ID: Caroline Gonzalez, female    DOB: 1996-08-03, 29 y.o.   MRN: [id number]  HPI  Patient is a 29 y.o. [name] female who presents for BP check per order from Eleanor Canny, CNM.     BP Readings from Last 3 Encounters:  09/20/24 132/79  09/18/24 125/84  09/04/24 127/80   Pulse Readings from Last 3 Encounters:  09/20/24 85  09/18/24 81  09/04/24 93    Objective:    There were no vitals taken for this visit.  Assessment:   1. Gestational hypertension, antepartum      Plan:   Per Dr. Damien Parsley, CNM:  Continue to monitor blood pressure at home. Report any reading >140/90 or with any associated symptoms. Return to clinic as scheduled.  Patient verbalized understanding of instructions.   Rollo JINNY Maxin, CMA  "

## 2024-09-22 ENCOUNTER — Ambulatory Visit (INDEPENDENT_AMBULATORY_CARE_PROVIDER_SITE_OTHER)

## 2024-09-22 VITALS — BP 121/73 | HR 85 | Ht 67.0 in | Wt 181.1 lb

## 2024-09-22 DIAGNOSIS — O139 Gestational [pregnancy-induced] hypertension without significant proteinuria, unspecified trimester: Secondary | ICD-10-CM

## 2024-09-22 DIAGNOSIS — Z0130 Encounter for examination of blood pressure without abnormal findings: Secondary | ICD-10-CM

## 2024-10-01 NOTE — Progress Notes (Unsigned)
"  ° °  Virtual Visit via Video Note  I connected with NAME@ on 10/01/24 at  10:55 AM EST by a video enabled telemedicine application and verified that I am speaking with the correct person using two identifiers.  Location: Patient: HOME Provider: OFFICE   I discussed the limitations of evaluation and management by telemedicine and the availability of in person appointments. The patient expressed understanding and agreed to proceed.    History of Present Illness:   Caroline Gonzalez is a 29 y.o. [phone] female who presents for a 2 week televisit for mood check. She is 2 weeks postpartum following a spontaneous vaginal delivery.  The delivery was at 40.3 gestational weeks.  Postpartum course has been well so far. Baby is feeding by breast. Bleeding: light bleeding. Postpartum depression screening: positive.  EDPS score is 14.      The following portions of the patient's history were reviewed and updated as appropriate: allergies, current medications, past family history, past medical history, past social history, past surgical history, and problem list.   Observations/Objective:   unknown if currently breastfeeding. Gen App: NAD Psych: normal speech, affect. Good mood.        10/02/2024   10:56 AM 09/20/2024    5:06 PM 03/06/2024    3:35 PM 02/07/2024    2:01 PM  Edinburgh Postnatal Depression Scale Screening Tool  I have been able to laugh and see the funny side of things. 1 0 0  1   I have looked forward with enjoyment to things. 1 0 0  1   I have blamed myself unnecessarily when things went wrong. 2 1 2  2    I have been anxious or worried for no good reason. 3 1 2  3    I have felt scared or panicky for no good reason. 2 1 2  2    Things have been getting on top of me. 2 1 1  1    I have been so unhappy that I have had difficulty sleeping. 0 0 0  0   I have felt sad or miserable. 1 0 1  1   I have been so unhappy that I have been crying. 2 1 1  1    The thought of harming myself  has occurred to me. 0 0 0  0   Edinburgh Postnatal Depression Scale Total 14 5 9 12       Data saved with a previous flowsheet row definition         Assessment and Plan:   1. Encounter for screening for maternal depression - Screening Positive today. Will rescreen at 6 week postpartum visit. Overall doing well.    2. Postpartum state - Overall doing well. Continue routine postpartum home care.    3. Lactating mother -    Follow Up Instructions:     I discussed the assessment and treatment plan with the patient. The patient was provided an opportunity to ask questions and all were answered. The patient agreed with the plan and demonstrated an understanding of the instructions.   The patient was advised to call back or seek an in-person evaluation if the symptoms worsen or if the condition fails to improve as anticipated.   I provided *** minutes of non-face-to-face time during this encounter.     Damien Parsley, CNM [facility]

## 2024-10-02 ENCOUNTER — Telehealth: Admitting: Certified Nurse Midwife

## 2024-10-02 NOTE — Patient Instructions (Signed)

## 2024-10-02 NOTE — Progress Notes (Signed)
 "   Virtual Visit via Video Note  I connected with [name] on 10/02/24 at  10:55 AM EST by a video enabled telemedicine application and verified that I am speaking with the correct person using two identifiers.  Location: Patient: HOME Provider: OFFICE   I discussed the limitations of evaluation and management by telemedicine and the availability of in person appointments. The patient expressed understanding and agreed to proceed.    History of Present Illness:   Caroline Gonzalez is a 29 y.o. [phone] female who presents for a 2 week televisit for mood check. She is 2 weeks postpartum following a spontaneous vaginal delivery.  The delivery was at 40.3 gestational weeks.  Postpartum course has been well so far. Baby is feeding by breast. Bleeding: light bleeding. Postpartum depression screening: positive.  EDPS score is 14.      The following portions of the patient's history were reviewed and updated as appropriate: allergies, current medications, past family history, past medical history, past social history, past surgical history, and problem list.   Observations/Objective:   unknown if currently breastfeeding. Gen App: NAD Psych: normal speech, affect. Good mood.        10/02/2024   10:56 AM 09/20/2024    5:06 PM 03/06/2024    3:35 PM 02/07/2024    2:01 PM  Edinburgh Postnatal Depression Scale Screening Tool  I have been able to laugh and see the funny side of things. 1 0 0  1   I have looked forward with enjoyment to things. 1 0 0  1   I have blamed myself unnecessarily when things went wrong. 2 1 2  2    I have been anxious or worried for no good reason. 3 1 2  3    I have felt scared or panicky for no good reason. 2 1 2  2    Things have been getting on top of me. 2 1 1  1    I have been so unhappy that I have had difficulty sleeping. 0 0 0  0   I have felt sad or miserable. 1 0 1  1   I have been so unhappy that I have been crying. 2 1 1  1    The thought of harming myself  has occurred to me. 0 0 0  0   Edinburgh Postnatal Depression Scale Total 14 5 9 12       Data saved with a previous flowsheet row definition         Assessment and Plan:   1. Encounter for screening for maternal depression - Screening Positive today. Will rescreen in 1 week for follow up visit. Overall doing well.    2. Postpartum state - Patient has been feeling anxious with racing thoughts, discussed starting Prozac  as patient has taken for anxiety in the past, patient would like to think about it before making a decision -Difficulty sleeping due to anxiety, recommended Hydroxizine PRN to aid when she has someone to watch the baby. Reviewed safety in breastfeeding patient will consider. - Has been experiencing decrease in appetite, discussed having quick snacks available  - Reports feeling anxious about going back to work at 6 weeks post partum, has not found someone to watch her baby - Recommended spending some time outside during the day and getting out of the house for some sunlight  - Follow up in 1 week   3. Lactating mother - Ezella has been clamping down on nipple after feeds, will watch cues of  when baby is done feeding and falling asleep to aid in unlatching - Is using Lanolin cream to help with nipple discomfort - No current cracking or bleeding   Follow Up Instructions:     I discussed the assessment and treatment plan with the patient. The patient was provided an opportunity to ask questions and all were answered. The patient agreed with the plan and demonstrated an understanding of the instructions.   The patient was advised to call back or seek an in-person evaluation if the symptoms worsen or if the condition fails to improve as anticipated.   I provided 15 minutes of non-face-to-face time during this encounter.     Damien Parsley, CNM [facility]

## 2024-10-05 NOTE — Progress Notes (Unsigned)
"  ° °  Virtual Visit via Video Note  I connected with NAME@ on 10/05/24 at  11:15 AM EST by a video enabled telemedicine application and verified that I am speaking with the correct person using two identifiers.  Location: Patient: *** Provider: OFFICE   I discussed the limitations of evaluation and management by telemedicine and the availability of in person appointments. The patient expressed understanding and agreed to proceed.    History of Present Illness:   Caroline Gonzalez is a 29 y.o. [phone] female who presents for a 3 week televisit for mood check. She is 3weeks postpartum following a normal spontaneous vaginal delivery.  The delivery was at 40.3 gestational weeks.  Postpartum course has been well so far. Baby is feeding by Breast. Bleeding: ***. Postpartum depression screening: {Desc; negative/positive:13464}.  EDPS score is ***.      The following portions of the patient's history were reviewed and updated as appropriate: allergies, current medications, past family history, past medical history, past social history, past surgical history, and problem list.   Observations/Objective:   unknown if currently breastfeeding. Gen App: NAD Psych: normal speech, affect. Good mood.        10/02/2024   10:56 AM 09/20/2024    5:06 PM 03/06/2024    3:35 PM 02/07/2024    2:01 PM  Edinburgh Postnatal Depression Scale Screening Tool  I have been able to laugh and see the funny side of things. 1 0 0  1   I have looked forward with enjoyment to things. 1 0 0  1   I have blamed myself unnecessarily when things went wrong. 2 1 2  2    I have been anxious or worried for no good reason. 3 1 2  3    I have felt scared or panicky for no good reason. 2 1 2  2    Things have been getting on top of me. 2 1 1  1    I have been so unhappy that I have had difficulty sleeping. 0 0 0  0   I have felt sad or miserable. 1 0 1  1   I have been so unhappy that I have been crying. 2 1 1  1    The thought  of harming myself has occurred to me. 0 0 0  0   Edinburgh Postnatal Depression Scale Total 14 5 9 12       Data saved with a previous flowsheet row definition         Assessment and Plan:   1. Encounter for screening for maternal depression - Screening {FINDINGS; POSITIVE NEGATIVE:[phone]} today. Will rescreen at 6 week postpartum visit. Overall doing well.    2. Postpartum state - Overall doing well. Continue routine postpartum home care.    3. Lactating mother -    Follow Up Instructions:     I discussed the assessment and treatment plan with the patient. The patient was provided an opportunity to ask questions and all were answered. The patient agreed with the plan and demonstrated an understanding of the instructions.   The patient was advised to call back or seek an in-person evaluation if the symptoms worsen or if the condition fails to improve as anticipated.   I provided *** minutes of non-face-to-face time during this encounter.   Damien Parsley, CNM [facility]

## 2024-10-07 ENCOUNTER — Telehealth: Admitting: Certified Nurse Midwife

## 2024-10-07 NOTE — Progress Notes (Signed)
"  ° °  Virtual Visit via Video Note   I connected [name] on 10/07/24 4:22 PM by a video enabled telemedicine application and verified that I am speaking with the correct person using two identifiers.   Location: Patient: at home Provider: [facility]   I discussed the limitations of evaluation and management by telemedicine and the availability of in person appointments. The patient expressed understanding and agreed to proceed. Subjective:    Subjective  Caroline Gonzalez is a 29 y.o. female who presents for a follow up postpartum visit for mood.       Review of Systems Pertinent positives noted in HPI. Remainder of comprehensive ROS otherwise negative.     Objective:      Objective  There were no vitals taken for this visit.  General:  alert, cooperative, and no distress        Assessment:    1. Encounter for screening for maternal depression - Screening Positive today. She feels better than last week. She has been getting more sleep and able to go outside a little bit.     Plan:      Plan  -Will check in at 6 week visit. Caroline Gonzalez know to reach out if she feels like she is feeling worse or needs additional support before then.     I discussed the assessment and treatment plan with the patient. The patient was provided an opportunity to ask questions and all were answered. The patient agreed with the plan and demonstrated an understanding of the instructions.   The patient was advised to call back or seek an in-person evaluation if the symptoms worsen or if the condition fails to improve as anticipated.   I provided 15 minutes of non-face-to-face time during this encounter.     Damien PARSLEY, CNM  "

## 2024-10-08 NOTE — Patient Instructions (Signed)

## 2024-10-30 ENCOUNTER — Ambulatory Visit: Admitting: Certified Nurse Midwife
